# Patient Record
Sex: Female | Born: 1980 | Race: White | Marital: Married | Smoking: Former smoker
Health system: Southern US, Community
[De-identification: ages and names within clinical notes are randomized; demographics above are authoritative.]

## PROBLEM LIST (undated history)

## (undated) DIAGNOSIS — Z789 Other specified health status: Secondary | ICD-10-CM

## (undated) HISTORY — PX: WISDOM TOOTH EXTRACTION: SHX21

---

## 2014-01-05 ENCOUNTER — Encounter: Payer: Self-pay | Admitting: Obstetrics and Gynecology

## 2014-04-10 ENCOUNTER — Observation Stay: Payer: Self-pay | Admitting: Obstetrics and Gynecology

## 2014-07-10 ENCOUNTER — Inpatient Hospital Stay: Payer: Self-pay | Admitting: Obstetrics and Gynecology

## 2014-07-10 LAB — CBC WITH DIFFERENTIAL/PLATELET
BASOS ABS: 0 10*3/uL (ref 0.0–0.1)
Basophil %: 0.3 %
EOS PCT: 0.7 %
Eosinophil #: 0.1 10*3/uL (ref 0.0–0.7)
HCT: 34.6 % — ABNORMAL LOW (ref 35.0–47.0)
HGB: 11.4 g/dL — AB (ref 12.0–16.0)
LYMPHS ABS: 1.7 10*3/uL (ref 1.0–3.6)
LYMPHS PCT: 19.4 %
MCH: 29.2 pg (ref 26.0–34.0)
MCHC: 33 g/dL (ref 32.0–36.0)
MCV: 89 fL (ref 80–100)
MONO ABS: 0.8 x10 3/mm (ref 0.2–0.9)
MONOS PCT: 9.1 %
Neutrophil #: 6.3 10*3/uL (ref 1.4–6.5)
Neutrophil %: 70.5 %
PLATELETS: 120 10*3/uL — AB (ref 150–440)
RBC: 3.91 10*6/uL (ref 3.80–5.20)
RDW: 14.1 % (ref 11.5–14.5)
WBC: 9 10*3/uL (ref 3.6–11.0)

## 2014-07-13 LAB — CBC WITH DIFFERENTIAL/PLATELET
BASOS ABS: 0 10*3/uL (ref 0.0–0.1)
Basophil %: 0.1 %
EOS ABS: 0.1 10*3/uL (ref 0.0–0.7)
Eosinophil %: 0.7 %
HCT: 27.8 % — ABNORMAL LOW (ref 35.0–47.0)
HGB: 8.9 g/dL — AB (ref 12.0–16.0)
LYMPHS ABS: 1.7 10*3/uL (ref 1.0–3.6)
Lymphocyte %: 13.5 %
MCH: 28.6 pg (ref 26.0–34.0)
MCHC: 32.1 g/dL (ref 32.0–36.0)
MCV: 89 fL (ref 80–100)
Monocyte #: 1 x10 3/mm — ABNORMAL HIGH (ref 0.2–0.9)
Monocyte %: 7.6 %
Neutrophil #: 9.9 10*3/uL — ABNORMAL HIGH (ref 1.4–6.5)
Neutrophil %: 78.1 %
Platelet: 121 10*3/uL — ABNORMAL LOW (ref 150–440)
RBC: 3.12 10*6/uL — ABNORMAL LOW (ref 3.80–5.20)
RDW: 14.3 % (ref 11.5–14.5)
WBC: 12.7 10*3/uL — AB (ref 3.6–11.0)

## 2014-07-13 LAB — HEMATOCRIT: HCT: 26.7 % — ABNORMAL LOW (ref 35.0–47.0)

## 2016-11-18 LAB — OB RESULTS CONSOLE HIV ANTIBODY (ROUTINE TESTING): HIV: NONREACTIVE

## 2016-11-19 ENCOUNTER — Other Ambulatory Visit: Payer: Self-pay | Admitting: Obstetrics and Gynecology

## 2016-11-19 DIAGNOSIS — Z369 Encounter for antenatal screening, unspecified: Secondary | ICD-10-CM

## 2016-11-20 ENCOUNTER — Encounter (HOSPITAL_COMMUNITY): Payer: Self-pay | Admitting: Obstetrics and Gynecology

## 2016-11-24 LAB — OB RESULTS CONSOLE GC/CHLAMYDIA: CHLAMYDIA, DNA PROBE: NEGATIVE

## 2016-11-24 LAB — OB RESULTS CONSOLE RPR: RPR: NONREACTIVE

## 2016-11-25 LAB — OB RESULTS CONSOLE GC/CHLAMYDIA: GC PROBE AMP, GENITAL: NEGATIVE

## 2016-12-04 ENCOUNTER — Ambulatory Visit: Payer: Self-pay

## 2016-12-04 ENCOUNTER — Ambulatory Visit (HOSPITAL_COMMUNITY): Payer: Self-pay

## 2016-12-15 ENCOUNTER — Ambulatory Visit: Payer: Self-pay

## 2016-12-15 ENCOUNTER — Ambulatory Visit: Admission: RE | Admit: 2016-12-15 | Payer: Self-pay | Source: Ambulatory Visit

## 2017-01-28 NOTE — H&P (Signed)
Chief Complaint:   Chief Complaint  Patient presents with  . Miscarriage  . Pre Op Consulting    HPI:  Tracey Terry is a 36 y.o. G2P1101 here for female here for Miscarriage and Pre Op Consulting   Context:   Tracey Terry learned yesterday on ultrasound that she has a fetal demise at 20.0 weeks.  We do not know the cause nor the implications to future pregnancies.  She was offered induction vs surgical evacuation of the uterus, and she has elected for D&E.  She presents today to sign consents, to have laminaria placed due to advanced gestational age, and to have blood drawn.  Pregnancy details: Estimated Date of Delivery: 06/16/17 Patient's last menstrual period was 09/05/2016 (exact date). this was consistent with a late-first trimester ultrasound to screen for aneuploidy, which was negative, NT 1.8.   Factors complicating this pregnancy   Obesity BMI 32.9   1mg  folic acid  P/C ratio: 53  Early Glucola: 75  AMA  Screening results and needs:  NOB:   blood type: A+   Ab screen  Neg  Pap  Neg/gc/c neg/  hpv neg HIV Neg  Hep B/RPR Neg/NR  Rubella  Immune  VZV Immune,Ua culture negative, Early glucola 75, CMP =normal , Prot/Creat (random) normal  [place]: 01/27/17: No sign of cardiac activity. Area of heart appears as single fluid filled area. Ascites seen surrounding fetal abdomen and head. Fetal chest appears compressed. By dates fetus should be [redacted]w[redacted]d, Measures [redacted]W[redacted]D. Early Fetal Demise with unknown etiology.   She denies any recent illness, or toxin exposure, does endorse painful varicose veins on her right leg.  Problem List  Date Reviewed: 12/31/16         Codes Priority Class Noted - Resolved   Supervision of other normal pregnancy, antepartum ICD-10-CM: Z34.80 ICD-9-CM: V22.1   Unknown - Present   Encounter for supervision of normal first pregnancy in first trimester ICD-10-CM: Z34.01 ICD-9-CM: V22.0   11/18/2016 - Present   Overview Addendum 01/27/2017 11:41 AM by Launa Flight, CMA    36 y.o. G2P1001 at [redacted]w[redacted]d by  LMP c/w  week ultrasound Sex of baby and name:  " "  Factors complicating this pregnancy   Obesity BMI 32.9 - early 1 hour GTT, CMP, p/c ratio, 1mg  folic acid, TWG 11-20 pounds   AMA  Screening results and needs:  NOB:   MBT A+   Ab screen  Neg  Pap  Neg/gc/c neg/  hpv neg HIV Neg  Hep B/RPR Neg/NR  Rubella  Immune  VZV Immune,Ua culture negative, Early glucola 75, CMP =normal , Prot/Creat (random) normal  First trimester: (Did they do cffDNA or NT/blood draw?)  Informaseq:   NT:1.8 MM      Second trimester (AFP/tetra): Declined December 31, 2016 Declined CF  28 weeks:   Blood consent:  Hgb:   Glucola:  Rhogam:   36 weeks:   GBS:   G/C:   Hgb:   Last [place]: 01/27/17: No sign of cardiac activity. Area of heart appears as single fluid filled area. Ascites seen surrounding fetal abdomen and head. Fetal chest appears compressed. By dates fetus should be [redacted]w[redacted]d, Measures [redacted]W[redacted]D. Early Fetal Demise with unknown etiology.   12/05/16 First trimester Screening U/S NT=1.8 MM, Left ovary contains a corpus luteum that measures 1.2 x01.1 x 1.1 cm.CRL= 60.4 MM, FHR= 160 BPM, Choroid plexus appears symmetric.  Immunization:    Flu in season -   Tdap at 27-36 weeks -   Social: no  changes  Contraception Plan:   Feeding Plan:        Obesity in pregnancy, antepartum ICD-10-CM: O99.210 ICD-9-CM: 649.13   11/18/2016 - Present   Overview Signed 11/18/2016  6:07 PM by Leamon Arnt, CNM     early 1 hour GTT, CMP, p/c ratio, 1mg  folic acid, TWG 11-20 pounds       AMA (advanced maternal age) multigravida 35+, first trimester ICD-10-CM: O09.521 ICD-9-CM: 659.63   11/18/2016 - Present   Overview Signed 12/05/2016  1:44 PM by Marjean Donna, CGC    12/05/16 GC for AMA.  Patient elects for first trimester screening.  CF and SMA screening declined.      RESOLVED: GBS (group B Streptococcus carrier), +RV culture, currently pregnant ICD-10-CM:  O99.820 ICD-9-CM: V23.89, V02.51   06/14/2014 - 11/18/2016   RESOLVED: Obesity, unspecified ICD-10-CM: E66.9 ICD-9-CM: 278.00   01/31/2014 - 11/18/2016       Past Medical History:  has a past medical history of Obesity, unspecified.  Past Surgical History:  has a past surgical history that includes wisdom teeth extraction. Family History: family history includes Breast cancer (age of onset: 40) in her maternal grandmother; Colon cancer in her paternal grandmother; High blood pressure (Hypertension) in her father. Social History:  reports that she has quit smoking. She has never used smokeless tobacco. She reports that she does not drink alcohol or use drugs. OB/GYN History:  OB History    Gravida Para Term Preterm AB Living   2 1 1     1    SAB TAB Ectopic Molar Multiple Live Births             1      Allergies: has No Known Allergies. Medications:  Current Outpatient Prescriptions prescribed today's visit: .  [START ON 01/30/2017] doxycycline (VIBRAMYCIN) 100 MG capsule, Take 2 capsules (200 mg total) by mouth once for 1 dose., Disp: 2 capsule, Rfl: 0 .  ibuprofen (ADVIL,MOTRIN) 800 MG tablet, Take 1 tablet (800 mg total) by mouth every 6 (six) hours as needed for Pain for up to 10 days., Disp: 65 tablet, Rfl: 1 .  LORazepam (ATIVAN) 0.5 MG tablet, Take 1 tablet (0.5 mg total) by mouth every 8 (eight) hours as needed for Anxiety for up to 10 days., Disp: 21 tablet, Rfl: 0 .  methylergonovine (METHERGINE) 0.2 mg tablet, Take 1 tablet (0.2 mg total) by mouth 4 (four) times daily., Disp: 12 tablet, Rfl: 0 .  oxyCODONE-acetaminophen (PERCOCET) 5-325 mg tablet, Take 1 tablet by mouth every 6 (six) hours as needed for Pain., Disp: 21 tablet, Rfl: 0 .  prenatal vit-iron fumarate-FA (PRENAVITE) tablet, Take 1 tablet by mouth once daily., Disp: , Rfl:   Review of Systems: General:   No fatigue or weight loss Eyes:   No vision changes Ears:   No hearing difficulty Respiratory:                No  cough or shortness of breath Pulmonary:   No asthma or shortness of breath Cardiovascular:        No chest pain, palpitations, dyspnea on exertion Gastrointestinal:          No abdominal bloating, chronic diarrhea, constipation, masses, pain or hematochezia Genitourinary:  No hematuria, dysuria, abnormal vaginal discharge, pelvic pain Lymphatic:  No swollen lymph nodes Musculoskeletal: No muscle weakness +varicose veins on right leg Neurologic:  No extremity weakness, syncope, seizure disorder Psychiatric:  No history of depression, delusions or suicidal/homicidal ideation  Exam:   BP 120/69   Pulse 69   Ht 172.7 cm (5' 7.99")   Wt (!) 102.1 kg (225 lb)   LMP 09/05/2016 (Exact Date)   BMI 34.22 kg/m   Body mass index is 34.22 kg/m.  WDWN white female in NAD   Lungs: CTA  CV : RRR without murmur   Breast: exam done in sitting and lying position : No dimpling or retraction, no dominant mass, no spontaneous discharge, no axillary adenopathy Neck:  no thyromegaly Abdomen: soft , no mass, normal active bowel sounds,  non-tender, no rebound tenderness LE: right leg with long-standing spider veins, beneath there are lumpy and trace-able veins with fullness and some tender spots.  No erythema or warmth. Pelvic: tanner stage 5 ,   External genitalia: vulva /labia no lesions, no varicosities  Urethra: no prolapse, no diverticulum, no caruncle  Bladder: no tenderness to palpation, no cystocele  Vagina: normal physiologic d/c, no lesions, normal apical support  Cervix: no lesions, no cervical motion tenderness    Uterus: 20wk size, gravid, non-tender  Adnexa: no masses bilaterally, non-tender      Impression:   The encounter diagnosis was Fetal demise before 22 weeks with retention of dead fetus.    Plan:   - laminaria placed today, 2x 3mm and 2x 4mm inserted without difficulty.   - labs to test for etiology of demise ordered, collected see below: - return tomorrow for  removal and replacement of laminaria.   D&E scheduled for 01/30/17.  Consents signed today, pre-op tomorrow @ [facility]. The patient and I discussed the technical aspects of the procedure including the potential for risks and complications.These include but are not limited to the risk of infection requiring post-operative antibiotics or further procedures.We talked about the risk of injury to adjacent organs including bladder, bowel, ureter, blood vessels or nerves, the need to convert to an open incision, possibleneed for blood transfusion andpostop complications such asthromboembolic or cardiopulmonary complications.All of her questions were answered. Her preoperative exam was completed andthe appropriate consents were signed.   - heat packs to varicosities - gave Rx for before and after procedures with instructions.  See list above     Orders Placed This Encounter  Procedures  . CBC w/auto Differential (5 Part)  . Parvovirus B19, Human, IgG/IgM - [facility]  . RPR - [facility]  . Lupus Anticoagulant Comp - [facility]  . Anticardiolip Ab, IgA/G/M, Qn - [facility]  . Thyroid Stimulating-Hormone (TSH)  . Antibody Screen - [facility]  . Toxoplasma gondii Ab, IgG, Qn - [facility]  . Toxoplasma gondii Ab,IgM,Qn - [facility]  . CMV Abs IgG/IgM - [facility]  . Comprehensive Metabolic Panel (CMP)    Standing Status:   Future    Number of Occurrences:   1    Standing Expiration Date:   01/28/2018  . HSV, IgM I/II Combination - [facility]    Standing Status:   Future    Number of Occurrences:   1    Standing Expiration Date:   01/28/2018    ----- Ranae Plumberhelsea Ward, MD Attending Obstetrician and Gynecologist [facility], Department of OB/GYN [facility]

## 2017-01-29 ENCOUNTER — Encounter
Admission: RE | Admit: 2017-01-29 | Discharge: 2017-01-29 | Disposition: A | Discharge status: Home / Self Care | Payer: BLUE CROSS/BLUE SHIELD | Source: Ambulatory Visit | Attending: Obstetrics & Gynecology | Admitting: Obstetrics & Gynecology

## 2017-01-29 DIAGNOSIS — Z3A20 20 weeks gestation of pregnancy: Secondary | ICD-10-CM | POA: Diagnosis not present

## 2017-01-29 DIAGNOSIS — R188 Other ascites: Secondary | ICD-10-CM | POA: Diagnosis not present

## 2017-01-29 DIAGNOSIS — O021 Missed abortion: Secondary | ICD-10-CM | POA: Diagnosis present

## 2017-01-29 DIAGNOSIS — Z6832 Body mass index (BMI) 32.0-32.9, adult: Secondary | ICD-10-CM | POA: Diagnosis not present

## 2017-01-29 DIAGNOSIS — Z87891 Personal history of nicotine dependence: Secondary | ICD-10-CM | POA: Diagnosis not present

## 2017-01-29 DIAGNOSIS — E669 Obesity, unspecified: Secondary | ICD-10-CM | POA: Diagnosis not present

## 2017-01-29 DIAGNOSIS — O99214 Obesity complicating childbirth: Secondary | ICD-10-CM | POA: Diagnosis not present

## 2017-01-29 LAB — TYPE AND SCREEN
ABO/RH(D): A POS
ANTIBODY SCREEN: NEGATIVE
EXTEND SAMPLE REASON: UNDETERMINED

## 2017-01-29 NOTE — Patient Instructions (Signed)
Your procedure is scheduled on: 01/30/17 Report to DAY SURGERY. 2ND FLOOR MEDICAL MALL ENTRANCE. To find out your arrival time please call [phone] between 1PM - 3PM on 01/29/17.  Remember: Instructions that are not followed completely may result in serious medical risk, up to and including death, or upon the discretion of your surgeon and anesthesiologist your surgery may need to be rescheduled.    __X__ 1. Do not eat food or drink liquids after midnight. No gum chewing or hard candies.     __X__ 2. No Alcohol for 24 hours before or after surgery.   ____ 3. Bring all medications with you on the day of surgery if instructed.    __X__ 4. Notify your doctor if there is any change in your medical condition     (cold, fever, infections).             ___x__5. No smoking within 24 hours of your surgery.     Do not wear jewelry, make-up, hairpins, clips or nail polish.  Do not wear lotions, powders, or perfumes.   Do not shave 48 hours prior to surgery. Men may shave face and neck.  Do not bring valuables to the hospital.    [facility] is not responsible for any belongings or valuables.               Contacts, dentures or bridgework may not be worn into surgery.  Leave your suitcase in the car. After surgery it may be brought to your room.  For patients admitted to the hospital, discharge time is determined by your                treatment team.   Patients discharged the day of surgery will not be allowed to drive home.   Please read over the following fact sheets that you were given:   Pain Booklet and MRSA Information   __x__ Take these medicines the morning of surgery with A SIP OF WATER:    1. May take Lorazepam if needed for anxiety or Oxycodone if needed for pain  2.   3.   4.  5.  6.  ____ Fleet Enema (as directed)   ____ Use CHG Soap as directed  ____ Use inhalers on the day of surgery  ____ Stop metformin 2 days prior to surgery    ____ Take 1/2 of usual insulin  dose the night before surgery and none on the morning of surgery.   ____ Stop Coumadin/Plavix/aspirin on   __X__ Stop Anti-inflammatories such as Advil, Aleve, Ibuprofen, Motrin, Naproxen, Naprosyn, Goodies,powder, or aspirin products.  OK to take Tylenol.   ____ Stop supplements until after surgery.    ____ Bring C-Pap to the hospital.

## 2017-01-30 ENCOUNTER — Ambulatory Visit: Payer: BLUE CROSS/BLUE SHIELD | Admitting: Anesthesiology

## 2017-01-30 ENCOUNTER — Encounter
Admission: RE | Disposition: A | Discharge status: Home / Self Care | Payer: Self-pay | Source: Ambulatory Visit | Attending: Obstetrics & Gynecology

## 2017-01-30 ENCOUNTER — Ambulatory Visit
Admission: RE | Admit: 2017-01-30 | Discharge: 2017-01-30 | Disposition: A | Discharge status: Home / Self Care | Payer: BLUE CROSS/BLUE SHIELD | Source: Ambulatory Visit | Attending: Obstetrics & Gynecology | Admitting: Obstetrics & Gynecology

## 2017-01-30 ENCOUNTER — Encounter: Payer: Self-pay | Admitting: *Deleted

## 2017-01-30 DIAGNOSIS — E669 Obesity, unspecified: Secondary | ICD-10-CM | POA: Insufficient documentation

## 2017-01-30 DIAGNOSIS — R188 Other ascites: Secondary | ICD-10-CM | POA: Insufficient documentation

## 2017-01-30 DIAGNOSIS — O021 Missed abortion: Secondary | ICD-10-CM | POA: Diagnosis not present

## 2017-01-30 DIAGNOSIS — Z3A20 20 weeks gestation of pregnancy: Secondary | ICD-10-CM | POA: Insufficient documentation

## 2017-01-30 DIAGNOSIS — Z87891 Personal history of nicotine dependence: Secondary | ICD-10-CM | POA: Insufficient documentation

## 2017-01-30 DIAGNOSIS — O99214 Obesity complicating childbirth: Secondary | ICD-10-CM | POA: Insufficient documentation

## 2017-01-30 DIAGNOSIS — Z6832 Body mass index (BMI) 32.0-32.9, adult: Secondary | ICD-10-CM | POA: Insufficient documentation

## 2017-01-30 HISTORY — PX: DILATION AND EVACUATION: SHX1459

## 2017-01-30 LAB — ABO/RH: ABO/RH(D): A POS

## 2017-01-30 LAB — KLEIHAUER-BETKE STAIN
FETAL CELLS %: 0 %
QUANTITATION FETAL HEMOGLOBIN: 0 mL

## 2017-01-30 SURGERY — DILATION AND EVACUATION, UTERUS, SECOND TRIMESTER
Anesthesia: General

## 2017-01-30 MED ORDER — PROPOFOL 10 MG/ML IV BOLUS
INTRAVENOUS | Status: DC | PRN
  Administered 2017-01-30: 200 mg via INTRAVENOUS

## 2017-01-30 MED ORDER — MIDAZOLAM HCL 2 MG/2ML IJ SOLN
INTRAMUSCULAR | Status: AC
  Filled 2017-01-30: qty 2

## 2017-01-30 MED ORDER — ROCURONIUM BROMIDE 100 MG/10ML IV SOLN
INTRAVENOUS | Status: DC | PRN
  Administered 2017-01-30 (×2): 20 mg via INTRAVENOUS

## 2017-01-30 MED ORDER — KETOROLAC TROMETHAMINE 30 MG/ML IJ SOLN
INTRAMUSCULAR | Status: DC | PRN
  Administered 2017-01-30: 30 mg via INTRAVENOUS

## 2017-01-30 MED ORDER — LIDOCAINE HCL (PF) 1 % IJ SOLN
INTRAMUSCULAR | Status: AC
  Filled 2017-01-30: qty 30

## 2017-01-30 MED ORDER — PROMETHAZINE HCL 25 MG/ML IJ SOLN
12.5000 mg | INTRAMUSCULAR | Status: DC | PRN
  Administered 2017-01-30: 12.5 mg via INTRAVENOUS

## 2017-01-30 MED ORDER — MISOPROSTOL 200 MCG PO TABS
ORAL_TABLET | ORAL | Status: AC
  Filled 2017-01-30: qty 5

## 2017-01-30 MED ORDER — FENTANYL CITRATE (PF) 100 MCG/2ML IJ SOLN
INTRAMUSCULAR | Status: AC
Start: 2017-01-30 — End: 2017-01-30
  Filled 2017-01-30: qty 2

## 2017-01-30 MED ORDER — DEXMEDETOMIDINE HCL IN NACL 200 MCG/50ML IV SOLN
INTRAVENOUS | Status: AC
  Filled 2017-01-30: qty 50

## 2017-01-30 MED ORDER — LIDOCAINE HCL (PF) 2 % IJ SOLN
INTRAMUSCULAR | Status: AC
  Filled 2017-01-30: qty 2

## 2017-01-30 MED ORDER — FENTANYL CITRATE (PF) 100 MCG/2ML IJ SOLN: 25.0000 ug | INTRAMUSCULAR | Status: DC | PRN

## 2017-01-30 MED ORDER — OXYCODONE HCL 5 MG/5ML PO SOLN: 5.0000 mg | Freq: Once | ORAL | Status: DC | PRN

## 2017-01-30 MED ORDER — DOXYCYCLINE HYCLATE 100 MG IV SOLR
100.0000 mg | Freq: Once | INTRAVENOUS | Status: AC
  Administered 2017-01-30: 100 mg via INTRAVENOUS
  Filled 2017-01-30: qty 100

## 2017-01-30 MED ORDER — PROMETHAZINE HCL 25 MG/ML IJ SOLN
INTRAMUSCULAR | Status: AC
  Administered 2017-01-30: 12.5 mg via INTRAVENOUS
  Filled 2017-01-30: qty 1

## 2017-01-30 MED ORDER — VASOPRESSIN 20 UNIT/ML IV SOLN
INTRAVENOUS | Status: AC
  Filled 2017-01-30: qty 1

## 2017-01-30 MED ORDER — OXYCODONE HCL 5 MG PO TABS: 5.0000 mg | ORAL_TABLET | Freq: Once | ORAL | Status: DC | PRN

## 2017-01-30 MED ORDER — SODIUM CHLORIDE 0.9 % IJ SOLN
INTRAMUSCULAR | Status: AC
  Filled 2017-01-30: qty 10

## 2017-01-30 MED ORDER — FAMOTIDINE 20 MG PO TABS
20.0000 mg | ORAL_TABLET | Freq: Once | ORAL | Status: AC
  Administered 2017-01-30: 20 mg via ORAL

## 2017-01-30 MED ORDER — DEXAMETHASONE SODIUM PHOSPHATE 10 MG/ML IJ SOLN
INTRAMUSCULAR | Status: DC | PRN
  Administered 2017-01-30: 10 mg via INTRAVENOUS

## 2017-01-30 MED ORDER — DEXMEDETOMIDINE HCL IN NACL 200 MCG/50ML IV SOLN
INTRAVENOUS | Status: DC | PRN
  Administered 2017-01-30: 20 ug via INTRAVENOUS

## 2017-01-30 MED ORDER — ONDANSETRON HCL 4 MG/2ML IJ SOLN
INTRAMUSCULAR | Status: AC
  Filled 2017-01-30: qty 2

## 2017-01-30 MED ORDER — SODIUM CHLORIDE 0.9 % IJ SOLN
INTRAMUSCULAR | Status: AC
  Filled 2017-01-30: qty 50

## 2017-01-30 MED ORDER — ONDANSETRON HCL 4 MG/2ML IJ SOLN
INTRAMUSCULAR | Status: DC | PRN
  Administered 2017-01-30: 4 mg via INTRAVENOUS

## 2017-01-30 MED ORDER — PROPOFOL 10 MG/ML IV BOLUS
INTRAVENOUS | Status: AC
  Filled 2017-01-30: qty 20

## 2017-01-30 MED ORDER — OXYTOCIN 10 UNIT/ML IJ SOLN
INTRAMUSCULAR | Status: DC | PRN
  Administered 2017-01-30: 40 [IU] via INTRAMUSCULAR

## 2017-01-30 MED ORDER — LACTATED RINGERS IV SOLN
INTRAVENOUS | Status: DC
  Administered 2017-01-30: 14:00:00 via INTRAVENOUS
  Administered 2017-01-30: 75 mL/h via INTRAVENOUS

## 2017-01-30 MED ORDER — FAMOTIDINE 20 MG PO TABS
ORAL_TABLET | ORAL | Status: AC
  Administered 2017-01-30: 20 mg via ORAL
  Filled 2017-01-30: qty 1

## 2017-01-30 MED ORDER — OXYTOCIN 10 UNIT/ML IJ SOLN
INTRAMUSCULAR | Status: AC
  Filled 2017-01-30: qty 4

## 2017-01-30 MED ORDER — METHYLERGONOVINE MALEATE 0.2 MG/ML IJ SOLN
INTRAMUSCULAR | Status: DC | PRN
Start: 2017-01-30 — End: 2017-01-30
  Administered 2017-01-30: 0.2 mg via INTRAMUSCULAR

## 2017-01-30 MED ORDER — SUGAMMADEX SODIUM 200 MG/2ML IV SOLN
INTRAVENOUS | Status: AC
  Filled 2017-01-30: qty 2

## 2017-01-30 MED ORDER — LIDOCAINE HCL (CARDIAC) 20 MG/ML IV SOLN
INTRAVENOUS | Status: DC | PRN
  Administered 2017-01-30: 100 mg via INTRAVENOUS

## 2017-01-30 MED ORDER — MISOPROSTOL 100 MCG PO TABS
ORAL_TABLET | ORAL | Status: DC | PRN
  Administered 2017-01-30: 1000 ug via RECTAL

## 2017-01-30 MED ORDER — VASOPRESSIN 20 UNIT/ML IV SOLN
INTRAVENOUS | Status: DC | PRN
Start: 2017-01-30 — End: 2017-01-30
  Administered 2017-01-30: 13.3 [IU]

## 2017-01-30 MED ORDER — MIDAZOLAM HCL 2 MG/2ML IJ SOLN
INTRAMUSCULAR | Status: DC | PRN
  Administered 2017-01-30: 2 mg via INTRAVENOUS

## 2017-01-30 MED ORDER — SUGAMMADEX SODIUM 200 MG/2ML IV SOLN
INTRAVENOUS | Status: DC | PRN
  Administered 2017-01-30: 200 mg via INTRAVENOUS

## 2017-01-30 MED ORDER — FENTANYL CITRATE (PF) 100 MCG/2ML IJ SOLN
INTRAMUSCULAR | Status: DC | PRN
  Administered 2017-01-30: 100 ug via INTRAVENOUS

## 2017-01-30 MED ORDER — DEXAMETHASONE SODIUM PHOSPHATE 10 MG/ML IJ SOLN
INTRAMUSCULAR | Status: AC
  Filled 2017-01-30: qty 1

## 2017-01-30 SURGICAL SUPPLY — 24 items
CANISTER SUC SOCK COL 7IN (MISCELLANEOUS) ×3 IMPLANT
CATH ROBINSON RED A/P 16FR (CATHETERS) ×3 IMPLANT
FILTER UTR ASPR SPEC (MISCELLANEOUS) ×1 IMPLANT
FLTR UTR ASPR SPEC (MISCELLANEOUS) ×3
GLOVE PI ORTHOPRO 6.5 (GLOVE) ×2
GLOVE PI ORTHOPRO STRL 6.5 (GLOVE) ×1 IMPLANT
GLOVE SURG SYN 6.5 ES PF (GLOVE) ×3 IMPLANT
GOWN STRL REUS W/ TWL LRG LVL3 (GOWN DISPOSABLE) ×2 IMPLANT
GOWN STRL REUS W/TWL LRG LVL3 (GOWN DISPOSABLE) ×4
KIT BERKELEY 1ST TRIMESTER 3/8 (MISCELLANEOUS) ×3 IMPLANT
KIT RM TURNOVER CYSTO AR (KITS) ×3 IMPLANT
NEEDLE SPNL 20GX3.5 QUINCKE YW (NEEDLE) ×3 IMPLANT
NS IRRIG 500ML POUR BTL (IV SOLUTION) ×3 IMPLANT
PACK DNC HYST (MISCELLANEOUS) ×3 IMPLANT
PAD OB MATERNITY 4.3X12.25 (PERSONAL CARE ITEMS) ×3 IMPLANT
PAD PREP 24X41 OB/GYN DISP (PERSONAL CARE ITEMS) ×3 IMPLANT
SET BERKELEY SUCTION TUBING (SUCTIONS) ×3 IMPLANT
SUT CHROMIC 3 0 SH 27 (SUTURE) ×3 IMPLANT
SYRINGE 10CC LL (SYRINGE) ×3 IMPLANT
TOWEL OR 17X26 4PK STRL BLUE (TOWEL DISPOSABLE) ×3 IMPLANT
TUBE VACURETTE 2ND TRIMESTER (CANNULA) ×3 IMPLANT
VACURETTE 10 RIGID CVD (CANNULA) IMPLANT
VACURETTE 12 RIGID CVD (CANNULA) IMPLANT
VACURETTE 8 RIGID CVD (CANNULA) IMPLANT

## 2017-01-30 NOTE — Anesthesia Procedure Notes (Signed)
Procedure Name: Intubation Date/Time: 01/30/2017 1:49 PM Performed by: Justus Memory Pre-anesthesia Checklist: Patient identified, Emergency Drugs available, Suction available, Patient being monitored and Timeout performed Patient Re-evaluated:Patient Re-evaluated prior to inductionOxygen Delivery Method: Circle system utilized Preoxygenation: Pre-oxygenation with 100% oxygen Intubation Type: IV induction and Rapid sequence Laryngoscope Size: Mac and 3 Grade View: Grade I Tube type: Oral Tube size: 7.0 mm Number of attempts: 1 Airway Equipment and Method: Stylet and Patient positioned with wedge pillow Placement Confirmation: ETT inserted through vocal cords under direct vision,  positive ETCO2,  CO2 detector and breath sounds checked- equal and bilateral Secured at: 21 cm Tube secured with: Tape Dental Injury: Teeth and Oropharynx as per pre-operative assessment

## 2017-01-30 NOTE — Transfer of Care (Signed)
Immediate Anesthesia Transfer of Care Note  Patient: Tracey Terry  Procedure(s) Performed: Procedure(s): DILATATION AND EVACUATION (D&E) 2ND TRIMESTER (N/A)  Patient Location: PACU  Anesthesia Type:General  Level of Consciousness: sedated  Airway & Oxygen Therapy: Patient Spontanous Breathing and Patient connected to face mask oxygen  Post-op Assessment: Report given to RN and Post -op Vital signs reviewed and stable  Post vital signs: Reviewed and stable  Last Vitals:  Vitals:   01/30/17 1503 01/30/17 1504  BP:  (!) 100/58  Pulse: 78   Resp: 18   Temp: 36.2 C     Last Pain:  Vitals:   01/30/17 1503  TempSrc:   PainSc: Asleep      Patients Stated Pain Goal: 0 (01/30/17 1114)  Complications: No apparent anesthesia complications

## 2017-01-30 NOTE — Anesthesia Post-op Follow-up Note (Cosign Needed)
Anesthesia QCDR form completed.        

## 2017-01-30 NOTE — Discharge Instructions (Signed)
You should expect to have some cramping and vaginal bleeding for about a week. This should taper off and subside, much like a period. If heavy bleeding continues or gets worse, you should contact the office for an earlier appointment.   Please call the office or physician on call for fever >101, severe pain, and heavy bleeding.   [phone]  NOTHING IN THE VAGINA FOR 2 WEEKS!!   AMBULATORY SURGERY  DISCHARGE INSTRUCTIONS   1) The drugs that you were given will stay in your system until tomorrow so for the next 24 hours you should not:  A) Drive an automobile B) Make any legal decisions C) Drink any alcoholic beverage   2) You may resume regular meals tomorrow.  Today it is better to start with liquids and gradually work up to solid foods.  You may eat anything you prefer, but it is better to start with liquids, then soup and crackers, and gradually work up to solid foods.   3) Please notify your doctor immediately if you have any unusual bleeding, trouble breathing, redness and pain at the surgery site, drainage, fever, or pain not relieved by medication.   4) Additional Instructions:

## 2017-01-30 NOTE — Pre-Procedure Instructions (Signed)
Appointment 02/06/18 Friday @ 3pm.

## 2017-01-30 NOTE — Anesthesia Preprocedure Evaluation (Signed)
Anesthesia Evaluation  Patient identified by MRN, date of birth, ID band Patient awake    Reviewed: Allergy & Precautions, H&P , NPO status , Patient's Chart, lab work & pertinent test results  History of Anesthesia Complications Negative for: history of anesthetic complications  Airway Mallampati: II  TM Distance: >3 FB Neck ROM: full    Dental  (+) Poor Dentition, Chipped   Pulmonary neg shortness of breath, former smoker,    Pulmonary exam normal breath sounds clear to auscultation       Cardiovascular Exercise Tolerance: Good (-) angina(-) Past MI and (-) DOE negative cardio ROS Normal cardiovascular exam Rhythm:regular Rate:Normal     Neuro/Psych negative neurological ROS  negative psych ROS   GI/Hepatic negative GI ROS, Neg liver ROS, neg GERD  ,  Endo/Other  negative endocrine ROS  Renal/GU      Musculoskeletal   Abdominal   Peds  Hematology negative hematology ROS (+)   Anesthesia Other Findings  Past Surgical History: No date: WISDOM TOOTH EXTRACTION  BMI    Body Mass Index:  35.24 kg/m      Reproductive/Obstetrics (+) Pregnancy                             Anesthesia Physical Anesthesia Plan  ASA: II  Anesthesia Plan: General ETT   Post-op Pain Management:    Induction:   Airway Management Planned:   Additional Equipment:   Intra-op Plan:   Post-operative Plan:   Informed Consent: I have reviewed the patients History and Physical, chart, labs and discussed the procedure including the risks, benefits and alternatives for the proposed anesthesia with the patient or authorized representative who has indicated his/her understanding and acceptance.   Dental Advisory Given  Plan Discussed with: Anesthesiologist, CRNA and Surgeon  Anesthesia Plan Comments:         Anesthesia Quick Evaluation

## 2017-01-30 NOTE — Op Note (Signed)
Operative Report Dilation and Evacuation   Indications:  20 week IUFD, s/p laminaria   Pre-operative Diagnosis: second trimester missed abortion/IUFD  Post-operative Diagnosis: same  Procedure: Ultrasound-Guided Dilation and Evacuation (16109, [id number]), placement of paracervical block (60454), and manual cervical dilation (09811)  Surgeon: Tracey Plumber, MD  Assistant(s):  no MD available  Anesthesia: General endotracheal anesthesia  Intraoperative medications: 0.75mcg Methergine IM, 100mg  IV doxycyline, 20cc 1%lidocaine with pitressin, cytotec PR, and 40mg  Oxytocin IV (in 1L crystalloid)      Total IV Fluids:Total I/O In: 1400 [I.V.:1400] Out: 200 [Urine:100; Blood:100]      Findings: Uterus measuring 18weeks;. absent cardiac activity.  Calvarium present with anterior placenta.   normal cervix, vagina, perineum  Post procedure: Calvarium, Thorax, 4x extremities, placenta accounted for.  Uterus with prominent and thin endometrial stripe.  Specimens: products of conception         Complications:  None; patient tolerated the procedure well.         Disposition: PACU - hemodynamically stable.         Condition: stable  Indication for procedure/Consents: Tracey Terry was found to have a fetus without cardiac activity and significant anasarca on her 20 week ultrasound.  She was given option of induction vs D&E and elected the latter.  She was given laminaria over 2 days prior to procedure to dilate the cervix.  Risks of surgery were discussed with the patient including but not limited to: bleeding which may require transfusion; infection which may require antibiotics; injury to uterus or surrounding organs; intrauterine scarring which may impair future fertility; need for additional procedures including laparotomy or laparoscopy; and other postoperative/anesthesia complications. Written informed consent was obtained.     Procedure in Detail:  The patient was  then taken to the operating room where anesthesia was administered and was found to be adequate.  After a formal timeout was performed, she was placed in the dorsal lithotomy position and examined with the above findings. She was then prepped and draped in a sterile manner.  A speculum was then placed in the patient's vagina and a ringed forceps was applied to the anterior lip of the cervix.  A paracervical block was placed with 10cc of Pitressin in Lidocaine in each 4:00 and 8:00 positions.  The 14-french curette was guided into the cervix under ultrasound guidance.  The suction was engaged and an amniotomy was created for clear and bloody fluid.  The curette was removed, and the Bierer forceps were used to evacuate the uterus.  Each entry was visualized on the ultrasound.  Tissue was removed after several passes.  A sharp curettage was then performed until there was a gritty texture in all four quadrants. The suction curette was re-introduced and minimal fluid/tissue was extracted.   A thin and well-definied endometrial stripe was evident on the ultrasound and the procedure was terminated.  The forceps were removed from the anterior lip of the cervix and the vaginal speculum was removed after noting good hemostasis. IM methergine was delivered, as well as cytotec rectally and 40mg  Oxytocin via IV.  Bimanual exam with massage of the fundus was performed and confirming the reduction in size of the uterus.  Following the procedure the following were accounted for: Calvarium 4 extremities Thorax Placenta Tissue was sent to pathology, and cytogenetics   The patient tolerated the procedure well and was taken to the recovery area awake, extubated and in stable condition.  The patient will be discharged to home as per  PACU criteria.  Routine postoperative instructions given. She will follow up in the clinic in one week for postoperative evaluation.  Tracey Plumberhelsea Jnaya Butrick, MD Gynecologist and  Tracey Legatobstetrician [facility]N [facility]

## 2017-01-30 NOTE — Interval H&P Note (Signed)
History and Physical Interval Note:  01/30/2017 12:59 PM   H&P Update  PLEASE SEE PREVIOUS H&P  Pt was last seen in my office, and complete history and physical performed.  The surgical history has been reviewed and remains accurate without interval change. The patient was re-examined and patient's physiologic condition has not changed significantly in the last 2 days.  No new pharmacological allergies or types of therapy has been initiated.  No Known Allergies  History reviewed. No pertinent past medical history. Past Surgical History:  Procedure Laterality Date  . WISDOM TOOTH EXTRACTION      BP 121/71   Pulse (!) 104   Temp 98 F (36.7 C) (Tympanic)   Resp 16   Ht 5\' 7"  (1.702 m)   Wt 102.1 kg (225 lb)   BMI 35.24 kg/m   NAD RRR no murmurs CTAB, no wheezing, resps unlabored +BS, soft, NTTP No c/c/e Pelvic exam deferred  The above history was confirmed with the patient. The condition still exists that makes this procedure necessary. Surgical plan includes ultrasound guided dilation and evacuation, as confirmed on the consent. The treatment plan remains the same, without new options for care.  The patient understands the potential benefits and risks and the consents have been signed and placed on the chart.     Ranae Plumberhelsea Ward, MD Attending Obstetrician Gynecologist [facility], Department of OBGYN [facility]

## 2017-01-30 NOTE — OR Nursing (Signed)
Dr Elesa MassedWard removed 6 laminaria from the vagina prior to prepping and draping witnessed by OR staff

## 2017-01-30 NOTE — OR Nursing (Signed)
Dr. Elesa MassedWard into see pt and husband.

## 2017-02-02 ENCOUNTER — Encounter: Payer: Self-pay | Admitting: Obstetrics & Gynecology

## 2017-02-03 LAB — SURGICAL PATHOLOGY

## 2017-02-10 NOTE — Anesthesia Postprocedure Evaluation (Signed)
Anesthesia Post Note  Patient: Kearney HardSarah Popson  Procedure(s) Performed: Procedure(s) (LRB): DILATATION AND EVACUATION (D&E) 2ND TRIMESTER (N/A)  Patient location during evaluation: PACU Anesthesia Type: General Level of consciousness: awake and alert Pain management: pain level controlled Vital Signs Assessment: post-procedure vital signs reviewed and stable Respiratory status: spontaneous breathing, nonlabored ventilation, respiratory function stable and patient connected to nasal cannula oxygen Cardiovascular status: blood pressure returned to baseline and stable Postop Assessment: no signs of nausea or vomiting Anesthetic complications: no     Last Vitals:  Vitals:   01/30/17 1612 01/30/17 1702  BP: (!) 110/54 (!) 112/55  Pulse: 79 67  Resp: 18 16  Temp: 36.3 C     Last Pain:  Vitals:   01/30/17 1702  TempSrc:   PainSc: 0-No pain                 Yevette EdwardsJames G Adams

## 2017-05-29 DIAGNOSIS — O0993 Supervision of high risk pregnancy, unspecified, third trimester: Secondary | ICD-10-CM | POA: Insufficient documentation

## 2017-05-29 LAB — OB RESULTS CONSOLE RUBELLA ANTIBODY, IGM: Rubella: IMMUNE

## 2017-05-29 LAB — OB RESULTS CONSOLE HIV ANTIBODY (ROUTINE TESTING): HIV: NONREACTIVE

## 2017-05-29 LAB — OB RESULTS CONSOLE HEPATITIS B SURFACE ANTIGEN: Hepatitis B Surface Ag: NEGATIVE

## 2017-05-29 LAB — OB RESULTS CONSOLE VARICELLA ZOSTER ANTIBODY, IGG: Varicella: IMMUNE

## 2017-06-15 ENCOUNTER — Ambulatory Visit
Admission: RE | Admit: 2017-06-15 | Discharge: 2017-06-15 | Disposition: A | Discharge status: Home / Self Care | Payer: BLUE CROSS/BLUE SHIELD | Source: Ambulatory Visit | Attending: Maternal & Fetal Medicine | Admitting: Maternal & Fetal Medicine

## 2017-06-15 ENCOUNTER — Encounter: Payer: Self-pay | Admitting: *Deleted

## 2017-06-15 DIAGNOSIS — Z3A12 12 weeks gestation of pregnancy: Secondary | ICD-10-CM | POA: Insufficient documentation

## 2017-06-15 DIAGNOSIS — Z369 Encounter for antenatal screening, unspecified: Secondary | ICD-10-CM

## 2017-06-15 DIAGNOSIS — O09521 Supervision of elderly multigravida, first trimester: Secondary | ICD-10-CM | POA: Insufficient documentation

## 2017-06-15 HISTORY — DX: Other specified health status: Z78.9

## 2017-06-15 NOTE — Progress Notes (Signed)
Referring Provider: Ranae PlumberWard, Chelsea MD    Length of Consultation: 45 minutes  Ms. Tracey Terry was [facility] for genetic counseling because of advanced maternal age.  The patient will be 36 years old at the time of delivery.  Ms. Tracey Terry was seen for genetic counseling in [facility] in December, 2017 for the same indication.  That pregnancy resulted in a IUFD at [redacted] weeks gestation with a reportedly normal fetal karyotype (normal female).  This note summarizes the information we discussed.    We explained that the chance of a chromosome abnormality increases with maternal age.  Chromosomes and examples of chromosome problems were reviewed.  Humans typically have 46 chromosomes in each cell, with half passed through each sperm and egg.  Any change in the number or structure of chromosomes can increase the risk of problems in the physical and mental development of a pregnancy.   Based upon age of the patient, the midtrimester chance of any chromosome abnormality was 1 in 5383. The chance of Down syndrome, the most common chromosome problem associated with maternal age, was 1 in 72166.  The risk of chromosome problems is in addition to the 3% general population risk for birth defects and mental retardation.  The greatest chance, of course, is that the baby would be born in good health.  We discussed the following prenatal screening and testing options for this pregnancy:  First trimester screening, which includes nuchal translucency ultrasound screen and first trimester maternal serum marker screening.  The nuchal translucency has approximately an 80% detection rate for Down syndrome and can be positive for other chromosome abnormalities as well as heart defects.  When combined with a maternal serum marker screening, the detection rate is up to 90% for Down syndrome and up to 97% for trisomy 18.     The chorionic villus sampling procedure is available for first  trimester chromosome analysis.  This involves the withdrawal of a small amount of chorionic villi (tissue from the developing placenta).  Risk of pregnancy loss is estimated to be approximately 1 in 200 to 1 in 100 (0.5 to 1%).  There is approximately a 1% (1 in 100) chance that the CVS chromosome results will be unclear.  Chorionic villi cannot be tested for neural tube defects.     Maternal serum marker screening, a blood test that measures pregnancy proteins, can provide risk assessments for Down syndrome, trisomy 18, and open neural tube defects (spina bifida, anencephaly). Because it does not directly examine the fetus, it cannot positively diagnose or rule out these problems.  Targeted ultrasound uses high frequency sound waves to create an image of the developing fetus.  An ultrasound is often recommended as a routine means of evaluating the pregnancy.  It is also used to screen for fetal anatomy problems (for example, a heart defect) that might be suggestive of a chromosomal or other abnormality.   Amniocentesis involves the removal of a small amount of amniotic fluid from the sac surrounding the fetus with the use of a thin needle inserted through the maternal abdomen and uterus.  Ultrasound guidance is used throughout the procedure.  Fetal cells from amniotic fluid are directly evaluated and > 99.5% of chromosome problems and > 98% of open neural tube defects can be detected. This procedure is generally performed after the 15th week of pregnancy.  The main risks to this procedure include complications leading to miscarriage in less than 1 in 200 cases (0.5%).  We also reviewed the availability of cell free fetal DNA testing from maternal blood to determine whether or not the baby may have either Down syndrome, trisomy 4, or trisomy 51.  This test utilizes a maternal blood sample and DNA sequencing technology to isolate circulating cell free fetal DNA from maternal plasma.  The fetal DNA can then  be analyzed for DNA sequences that are derived from the three most common chromosomes involved in aneuploidy, chromosomes 13, 18, and 21.  If the overall amount of DNA is greater than the expected level for any of these chromosomes, aneuploidy is suspected.  While we do not consider it a replacement for invasive testing and karyotype analysis, a negative result from this testing would be reassuring, though not a guarantee of a normal chromosome complement for the baby.  An abnormal result is certainly suggestive of an abnormal chromosome complement, though we would still recommend CVS or amniocentesis to confirm any findings from this testing.  Cystic Fibrosis and Spinal Muscular Atrophy (SMA) screening were also discussed with the patient. Both conditions are recessive, which means that both parents must be carriers in order to have a child with the disease.  Cystic fibrosis (CF) is one of the most common genetic conditions in persons of Caucasian ancestry.  This condition occurs in approximately 1 in 2,500 Caucasian persons and results in thickened secretions in the lungs, digestive, and reproductive systems.  For a baby to be at risk for having CF, both of the parents must be carriers for this condition.  Approximately 1 in 75 Caucasian persons is a carrier for CF.  Current carrier testing looks for the most common mutations in the gene for CF and can detect approximately 90% of carriers in the Caucasian population.  This means that the carrier screening can greatly reduce, but cannot eliminate, the chance for an individual to have a child with CF.  If an individual is found to be a carrier for CF, then carrier testing would be available for the partner. As part of [place]'s newborn screening profile, all babies born in the state of [place] will have a two-tier screening process.  Specimens are first tested to determine the concentration of immunoreactive trypsinogen (IRT).  The top 5% of  specimens with the highest IRT values then undergo DNA testing using a panel of over 40 common CF mutations. SMA is a neurodegenerative disorder that leads to atrophy of skeletal muscle and overall weakness.  This condition is also more prevalent in the Caucasian population, with 1 in 40-1 in 60 persons being a carrier and 1 in 6,000-1 in 10,000 children being affected.  There are multiple forms of the disease, with some causing death in infancy to other forms with survival into adulthood.  The genetics of SMA is complex, but carrier screening can detect up to 95% of carriers in the Caucasian population.  Similar to CF, a negative result can greatly reduce, but cannot eliminate, the chance to have a child with SMA.  We obtained a detailed family history and pregnancy history.  As stated previously, the patient's most recent pregnancy resulted in a IUFD at [redacted] weeks gestation (fetus was measuring [redacted] weeks gestation).  As reported by the patient, a chromosome study was performed and reported out as "normal female".  She did not name the fetus.  We were unable to obtain those results though our system or [facility] system.  If the karyotype was performed and was a normal result, we cannot  exclude a genetic etiology.  If the fetus had a chromosome condition, the risk for recurrence (excluding unbalanced translocations) would not be expected to be increased above the patient's age-related risk.   The remainder of the family history is unremarkable for birth defects, developmental delays, recurrent pregnancy loss or known chromosome abnormalities. Consanguinity is denied.  Ms. Fleener reported no additional pregnancy exposures or complications.  After consideration of the options, Ms. Pautler elected to proceed with a first trimester ultrasound today.  She plans to speak with her husband about testing options and call us back if she elects to have screening performed.  At this time, the patient feels that she may want to  pursue prenatal cell-free DNA in the second trimester and if those results are abnormal, may consider amniocentesis.  Ms. Mancini has elected to decline carrier screening for CF and SMA.    An ultrasound was performed at the time of the visit.   Fetal anatomy could not be assessed due to early gestational age.  Please refer to the ultrasound report for details of that study.  Ms. Vought was encouraged to call with questions or concerns.  We can be contacted at [phone].   Tests Ordered: NONE

## 2017-06-15 NOTE — Progress Notes (Signed)
Pt seen by me, agree with assessment and plan as outlined by CGC Nunez.  

## 2017-06-23 ENCOUNTER — Ambulatory Visit
Admission: RE | Admit: 2017-06-23 | Discharge: 2017-06-23 | Disposition: A | Discharge status: Home / Self Care | Payer: BLUE CROSS/BLUE SHIELD | Source: Ambulatory Visit | Attending: Obstetrics and Gynecology | Admitting: Obstetrics and Gynecology

## 2017-06-23 ENCOUNTER — Other Ambulatory Visit: Payer: Self-pay | Admitting: Obstetrics and Gynecology

## 2017-06-23 DIAGNOSIS — M79661 Pain in right lower leg: Secondary | ICD-10-CM | POA: Diagnosis not present

## 2017-09-10 IMAGING — US US EXTREM LOW VENOUS*R*
1 series · 13 of 24 positions shown · non-contrast
Comparison: None.

CLINICAL DATA: Calf pain for 3 months.



[Series 1: us extrem low venous*right* · 0.08mm/px · 13 of 34 slices shown]
[im 1/34]
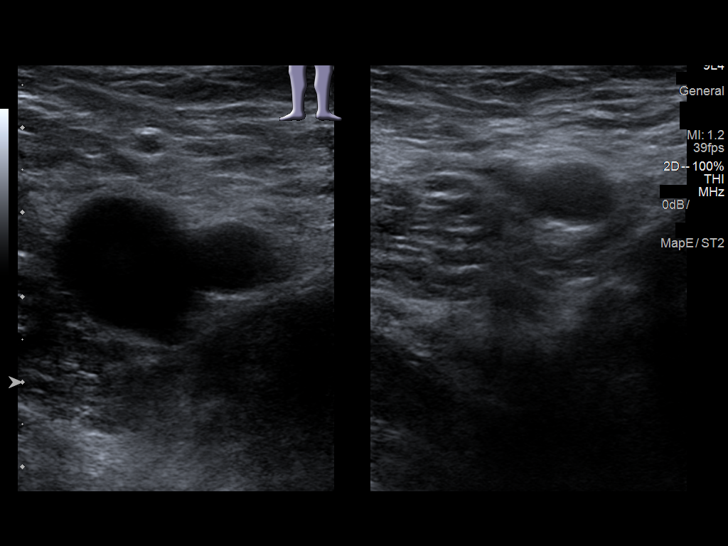
[im 3/34]
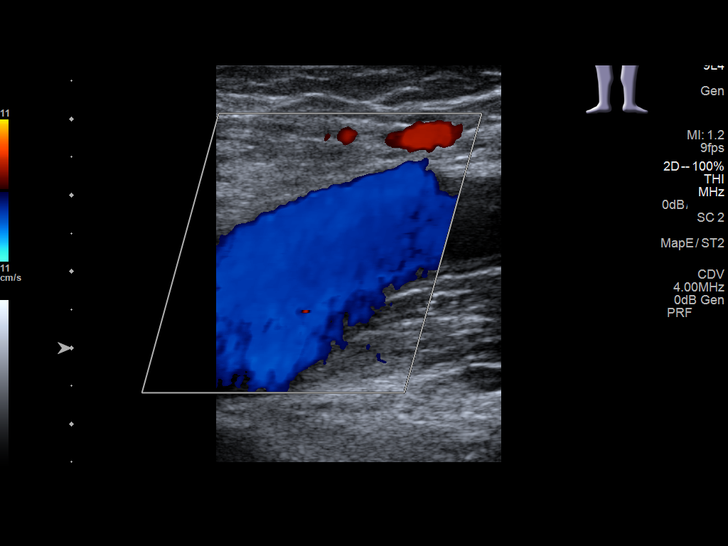
[im 6/34]
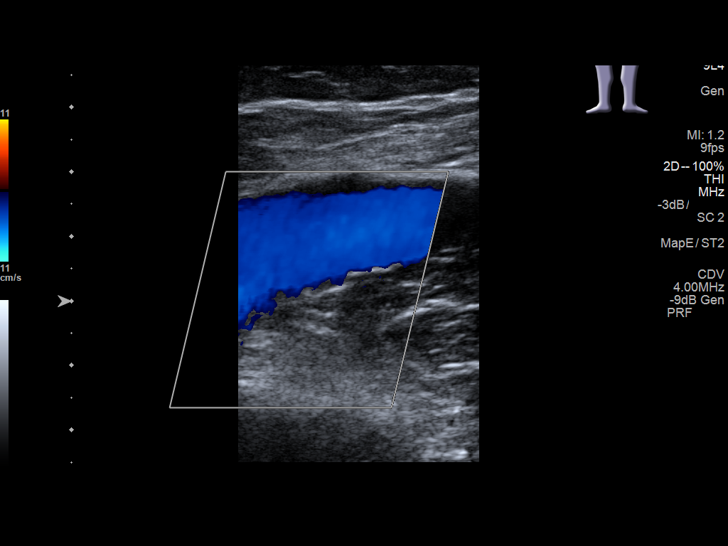
[im 9/34]
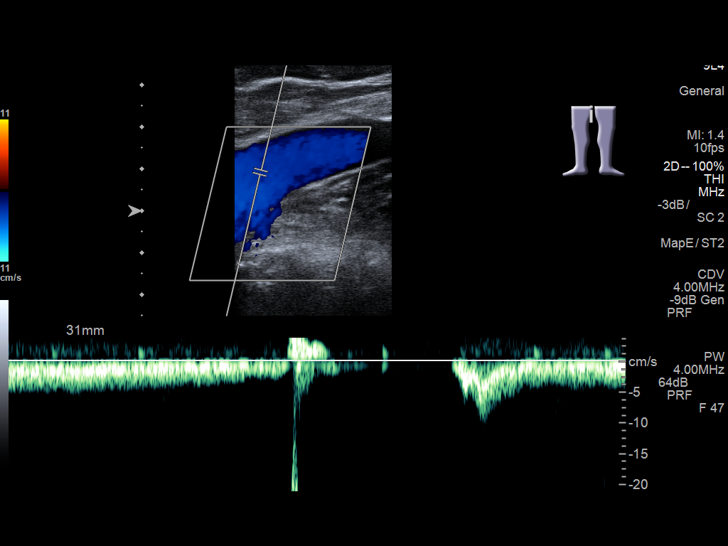
[im 12/34]
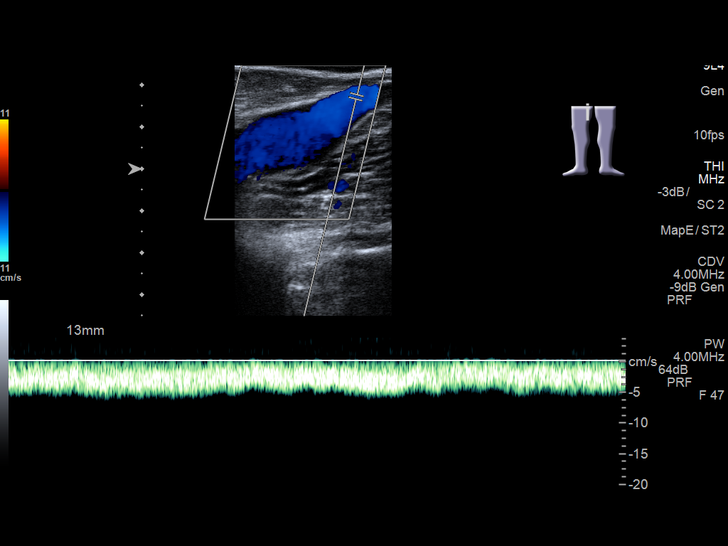
[im 15/34]
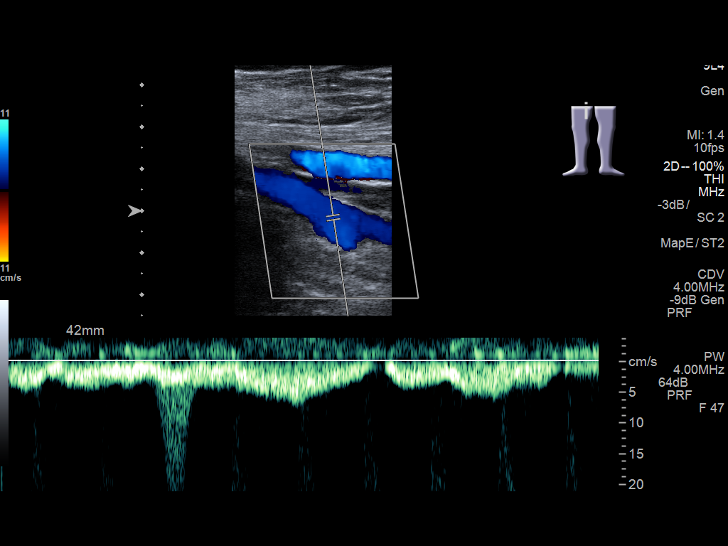
[im 18/34]
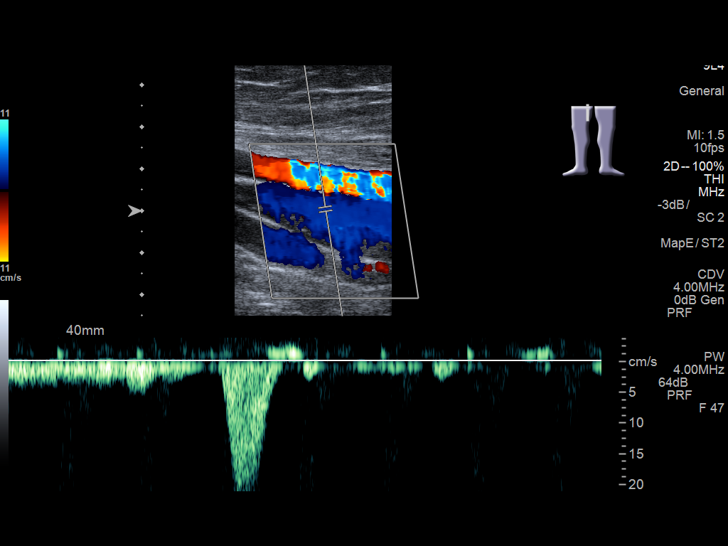
[im 19/34]
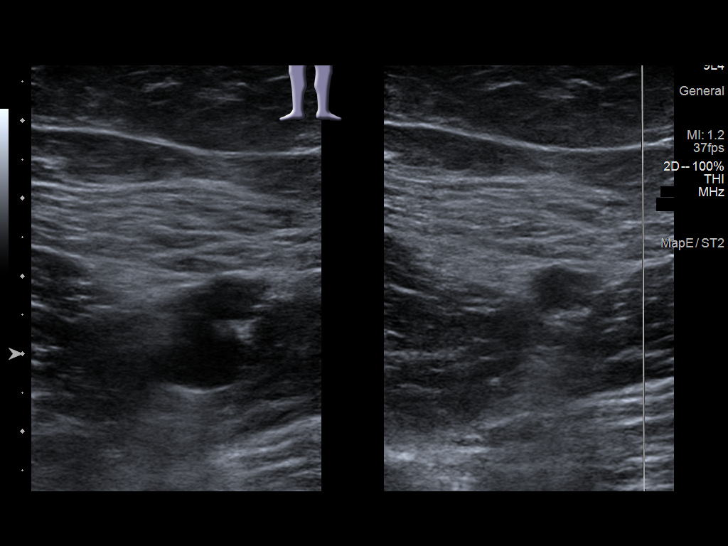
[im 22/34]
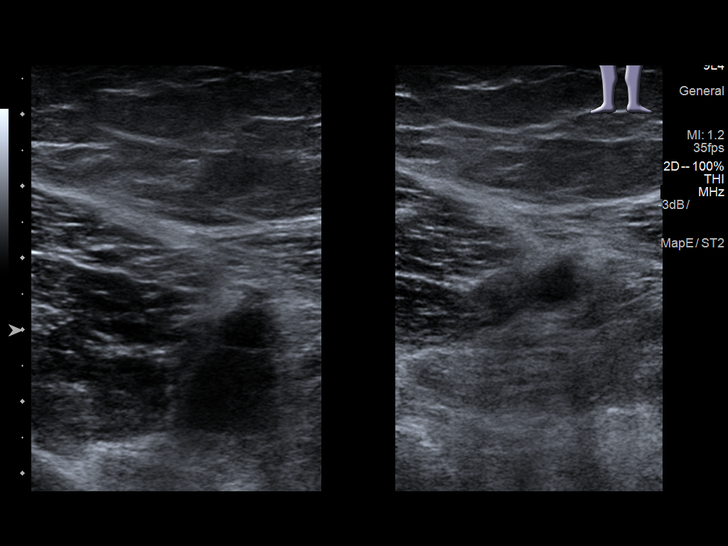
[im 25/34]
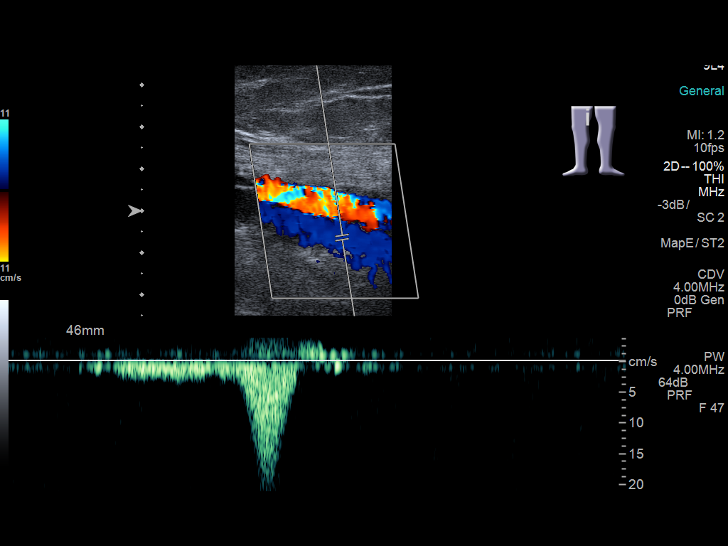
[im 28/34]
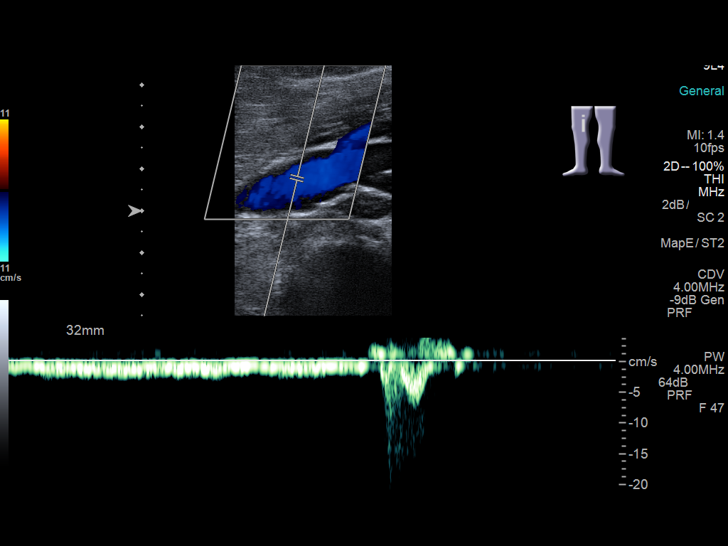
[im 31/34]
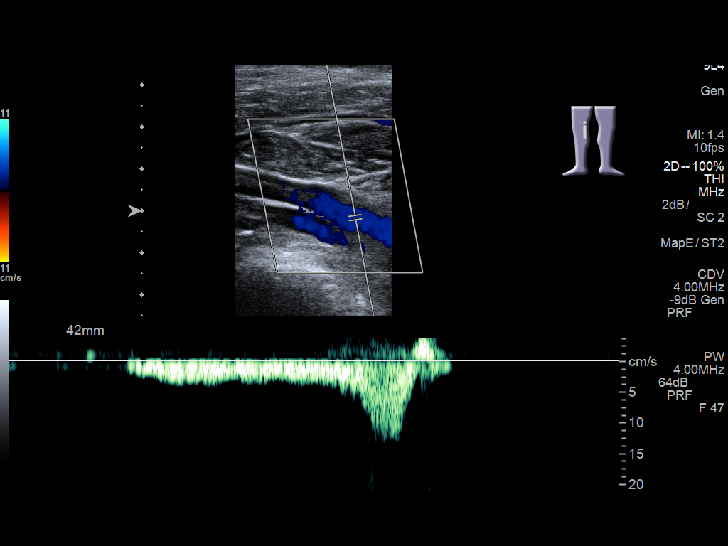
[im 34/34]
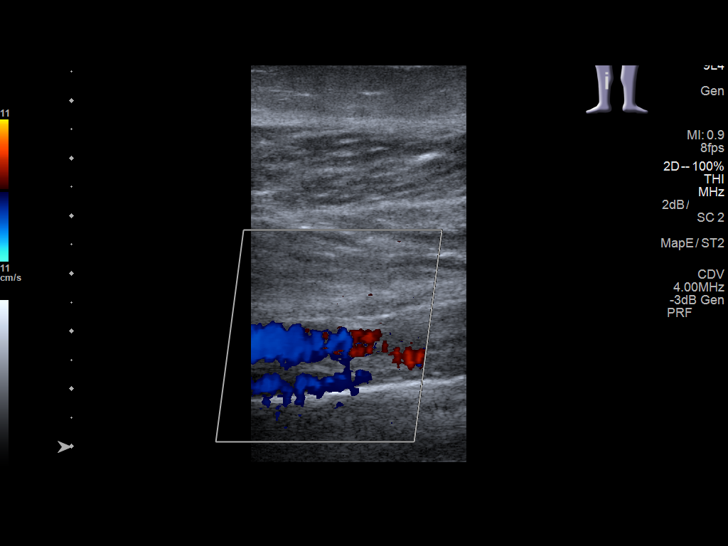

[13 of 24 positions shown; findings below may reference images not displayed]

FINDINGS: Contralateral Common Femoral Vein: Respiratory phasicity is normal
and symmetric with the symptomatic side. No evidence of thrombus.
Normal compressibility.

Common Femoral Vein: No evidence of thrombus. Normal
compressibility, respiratory phasicity and response to augmentation.

Saphenofemoral Junction: No evidence of thrombus. Normal
compressibility and flow on color Doppler imaging.

Profunda Femoral Vein: No evidence of thrombus. Normal
compressibility and flow on color Doppler imaging.

Femoral Vein: No evidence of thrombus. Normal compressibility,
respiratory phasicity and response to augmentation.

Popliteal Vein: No evidence of thrombus. Normal compressibility,
respiratory phasicity and response to augmentation.

Calf Veins: No evidence of thrombus. Normal compressibility and flow
on color Doppler imaging.

Superficial Great Saphenous Vein: No evidence of thrombus. Normal
compressibility and flow on color Doppler imaging.

Other Findings:  None.
IMPRESSION: No evidence of DVT within the right lower extremity.

## 2017-11-24 LAB — OB RESULTS CONSOLE RPR: RPR: NONREACTIVE

## 2017-11-24 NOTE — L&D Delivery Note (Signed)
Date of delivery: 12/16/17 Estimated Date of Delivery: 12/17/17 Patient's last menstrual period was 03/12/2017. EGA: 6527w6d  Delivery Note At 4:02 PM a viable female was delivered via Vaginal, Spontaneous (Presentation: cephalic; direct OA).  APGAR: 8, 9; weight 8 lb 11.7 oz (3960 g).   Placenta status: spontaneous, intact.  Cord: 3vv, nuchal x1, loose,  with the following complications: .  Cord pH: not collected  Anesthesia:  epidural Episiotomy: None Lacerations: None Suture Repair: n/a Est. Blood Loss (mL): 350cc (measured)  Mom presented to L&D for induction at term, cervical ripening with cytotec, AROM'd and augmented with pitocin.  epidual placed. Progressed to complete, second stage: 4 pushes. delivery of fetal head with restitution to LOT  Anterior then posterior shoulders delivered without difficulty.  Nuchal cord reduced after delivery.  Baby placed on mom's chest, and attended to by peds. Cord was then clamped and cut by FOB Lloyd HugerNeil when pulseless.  Placenta spontaneously delivered, intact.   IV pitocin given for hemorrhage prophylaxis.   We sang happy birthday to baby boy to be named!    Mom to postpartum.  Baby to Couplet care / Skin to Skin.  Monicka Cyran C Teola Felipe 12/16/2017, 5:52 PM

## 2017-11-25 LAB — OB RESULTS CONSOLE GC/CHLAMYDIA
Chlamydia: NEGATIVE
Gonorrhea: NEGATIVE

## 2017-11-25 LAB — OB RESULTS CONSOLE GBS: STREP GROUP B AG: POSITIVE

## 2017-12-15 ENCOUNTER — Inpatient Hospital Stay
Admission: EM | Admit: 2017-12-15 | Discharge: 2017-12-18 | DRG: 807 | Disposition: A | Discharge status: Home / Self Care | Payer: BLUE CROSS/BLUE SHIELD | Attending: Obstetrics and Gynecology | Admitting: Obstetrics and Gynecology

## 2017-12-15 DIAGNOSIS — Z3490 Encounter for supervision of normal pregnancy, unspecified, unspecified trimester: Secondary | ICD-10-CM | POA: Diagnosis present

## 2017-12-15 DIAGNOSIS — E669 Obesity, unspecified: Secondary | ICD-10-CM | POA: Diagnosis present

## 2017-12-15 DIAGNOSIS — O9912 Other diseases of the blood and blood-forming organs and certain disorders involving the immune mechanism complicating childbirth: Principal | ICD-10-CM | POA: Diagnosis present

## 2017-12-15 DIAGNOSIS — Z7982 Long term (current) use of aspirin: Secondary | ICD-10-CM

## 2017-12-15 DIAGNOSIS — O99214 Obesity complicating childbirth: Secondary | ICD-10-CM | POA: Diagnosis present

## 2017-12-15 DIAGNOSIS — Z3A39 39 weeks gestation of pregnancy: Secondary | ICD-10-CM

## 2017-12-15 DIAGNOSIS — D6959 Other secondary thrombocytopenia: Secondary | ICD-10-CM | POA: Diagnosis present

## 2017-12-15 DIAGNOSIS — O99824 Streptococcus B carrier state complicating childbirth: Secondary | ICD-10-CM | POA: Diagnosis present

## 2017-12-15 DIAGNOSIS — Z87891 Personal history of nicotine dependence: Secondary | ICD-10-CM

## 2017-12-15 MED ORDER — TERBUTALINE SULFATE 1 MG/ML IJ SOLN: 0.2500 mg | Freq: Once | INTRAMUSCULAR | Status: DC | PRN

## 2017-12-15 MED ORDER — ONDANSETRON HCL 4 MG/2ML IJ SOLN: 4.0000 mg | Freq: Four times a day (QID) | INTRAMUSCULAR | Status: DC | PRN

## 2017-12-15 MED ORDER — SODIUM CHLORIDE 0.9 % IV SOLN
1.0000 g | INTRAVENOUS | Status: DC
  Administered 2017-12-16: 1 g via INTRAVENOUS
  Filled 2017-12-15 (×7): qty 1000

## 2017-12-15 MED ORDER — LACTATED RINGERS IV SOLN
500.0000 mL | INTRAVENOUS | Status: DC | PRN
  Administered 2017-12-16 (×2): 500 mL via INTRAVENOUS

## 2017-12-15 MED ORDER — SODIUM CHLORIDE 0.9 % IV SOLN
2.0000 g | Freq: Once | INTRAVENOUS | Status: AC
  Administered 2017-12-16: 2 g via INTRAVENOUS
  Filled 2017-12-15: qty 2000

## 2017-12-15 MED ORDER — OXYTOCIN BOLUS FROM INFUSION
500.0000 mL | Freq: Once | INTRAVENOUS | Status: AC
  Administered 2017-12-16: 500 mL via INTRAVENOUS

## 2017-12-15 MED ORDER — OXYTOCIN 40 UNITS IN LACTATED RINGERS INFUSION - SIMPLE MED
2.5000 [IU]/h | INTRAVENOUS | Status: DC
  Administered 2017-12-16: 2.5 [IU]/h via INTRAVENOUS
  Filled 2017-12-15: qty 1000

## 2017-12-15 MED ORDER — SOD CITRATE-CITRIC ACID 500-334 MG/5ML PO SOLN: 30.0000 mL | ORAL | Status: DC | PRN

## 2017-12-15 MED ORDER — ACETAMINOPHEN 325 MG PO TABS: 650.0000 mg | ORAL_TABLET | ORAL | Status: DC | PRN

## 2017-12-15 MED ORDER — MISOPROSTOL 25 MCG QUARTER TABLET
25.0000 ug | ORAL_TABLET | ORAL | Status: DC | PRN
  Administered 2017-12-16 (×2): 25 ug via VAGINAL
  Filled 2017-12-15 (×3): qty 1

## 2017-12-15 MED ORDER — LACTATED RINGERS IV SOLN
INTRAVENOUS | Status: DC
  Administered 2017-12-16 (×2): via INTRAVENOUS

## 2017-12-15 MED ORDER — LIDOCAINE HCL (PF) 1 % IJ SOLN
30.0000 mL | INTRAMUSCULAR | Status: DC | PRN
  Filled 2017-12-15: qty 30

## 2017-12-16 ENCOUNTER — Inpatient Hospital Stay: Payer: BLUE CROSS/BLUE SHIELD | Admitting: Anesthesiology

## 2017-12-16 ENCOUNTER — Other Ambulatory Visit: Payer: Self-pay

## 2017-12-16 ENCOUNTER — Other Ambulatory Visit: Payer: Self-pay | Admitting: Obstetrics and Gynecology

## 2017-12-16 LAB — CBC
HCT: 37.6 % (ref 35.0–47.0)
Hemoglobin: 12.9 g/dL (ref 12.0–16.0)
MCH: 30.2 pg (ref 26.0–34.0)
MCHC: 34.4 g/dL (ref 32.0–36.0)
MCV: 87.7 fL (ref 80.0–100.0)
PLATELETS: 130 10*3/uL — AB (ref 150–440)
RBC: 4.28 MIL/uL (ref 3.80–5.20)
RDW: 14.1 % (ref 11.5–14.5)
WBC: 8.2 10*3/uL (ref 3.6–11.0)

## 2017-12-16 LAB — TYPE AND SCREEN
ABO/RH(D): A POS
Antibody Screen: NEGATIVE

## 2017-12-16 MED ORDER — DIPHENHYDRAMINE HCL 50 MG/ML IJ SOLN: 12.5000 mg | INTRAMUSCULAR | Status: DC | PRN

## 2017-12-16 MED ORDER — FENTANYL 2.5 MCG/ML W/ROPIVACAINE 0.15% IN NS 100 ML EPIDURAL (ARMC)
EPIDURAL | Status: AC
  Filled 2017-12-16: qty 100

## 2017-12-16 MED ORDER — MISOPROSTOL 200 MCG PO TABS
ORAL_TABLET | ORAL | Status: AC
  Administered 2017-12-16: 25 ug via VAGINAL
  Filled 2017-12-16: qty 4

## 2017-12-16 MED ORDER — LIDOCAINE HCL (PF) 1 % IJ SOLN
INTRAMUSCULAR | Status: DC | PRN
  Administered 2017-12-16 (×3): 3 mL

## 2017-12-16 MED ORDER — OXYTOCIN 10 UNIT/ML IJ SOLN
INTRAMUSCULAR | Status: AC
  Filled 2017-12-16: qty 2

## 2017-12-16 MED ORDER — LACTATED RINGERS IV SOLN: 500.0000 mL | Freq: Once | INTRAVENOUS | Status: DC

## 2017-12-16 MED ORDER — IBUPROFEN 600 MG PO TABS
600.0000 mg | ORAL_TABLET | Freq: Four times a day (QID) | ORAL | Status: DC
  Administered 2017-12-16 – 2017-12-18 (×6): 600 mg via ORAL
  Filled 2017-12-16 (×5): qty 1

## 2017-12-16 MED ORDER — IBUPROFEN 600 MG PO TABS
ORAL_TABLET | ORAL | Status: AC
  Administered 2017-12-16: 600 mg via ORAL
  Filled 2017-12-16: qty 1

## 2017-12-16 MED ORDER — BUPIVACAINE HCL (PF) 0.25 % IJ SOLN
INTRAMUSCULAR | Status: DC | PRN
  Administered 2017-12-16 (×2): 4 mL via EPIDURAL

## 2017-12-16 MED ORDER — AMMONIA AROMATIC IN INHA
RESPIRATORY_TRACT | Status: AC
  Filled 2017-12-16: qty 10

## 2017-12-16 MED ORDER — PHENYLEPHRINE 40 MCG/ML (10ML) SYRINGE FOR IV PUSH (FOR BLOOD PRESSURE SUPPORT)
80.0000 ug | PREFILLED_SYRINGE | INTRAVENOUS | Status: DC | PRN
  Filled 2017-12-16: qty 5

## 2017-12-16 MED ORDER — LIDOCAINE-EPINEPHRINE (PF) 1.5 %-1:200000 IJ SOLN
INTRAMUSCULAR | Status: DC | PRN
  Administered 2017-12-16: 3 mL via PERINEURAL

## 2017-12-16 MED ORDER — OXYTOCIN 40 UNITS IN LACTATED RINGERS INFUSION - SIMPLE MED
1.0000 m[IU]/min | INTRAVENOUS | Status: DC
  Administered 2017-12-16: 2 m[IU]/min via INTRAVENOUS

## 2017-12-16 MED ORDER — FENTANYL 2.5 MCG/ML W/ROPIVACAINE 0.15% IN NS 100 ML EPIDURAL (ARMC)
12.0000 mL/h | EPIDURAL | Status: DC
  Administered 2017-12-16: 12 mL/h via EPIDURAL

## 2017-12-16 MED ORDER — EPHEDRINE 5 MG/ML INJ
10.0000 mg | INTRAVENOUS | Status: DC | PRN
  Filled 2017-12-16: qty 2

## 2017-12-16 NOTE — H&P (Signed)
OB History & Physical   History of Present Illness:  Chief Complaint:   HPI:  Tracey Terry is a 37 y.o. [id number] female at [redacted]w[redacted]d dated by LMP with early ultrasound.  She presents to L&D for IOL at term.  +FM, no CTX, no LOF, no VB  Pregnancy Issues: 1. History of >20wk demise 2. Obesity 3. Gestational thrombocytopenia 4. AMA 5. GBS+  Maternal Medical History:   Past Medical History:  Diagnosis Date  . Medical history non-contributory     Past Surgical History:  Procedure Laterality Date  . DILATION AND EVACUATION N/A 01/30/2017   Procedure: DILATATION AND EVACUATION (D&E) 2ND TRIMESTER;  Surgeon: Elenora Fender Brenton Joines, MD;  Location: [facility] ORS;  Service: Gynecology;  Laterality: N/A;  . WISDOM TOOTH EXTRACTION      No Known Allergies  Prior to Admission medications   Medication Sig Start Date End Date Taking? Authorizing Provider  aspirin 81 MG chewable tablet Chew 81 mg by mouth daily.   Yes [provider]  folic acid (FOLVITE) 400 MCG tablet Take 400 mcg by mouth daily.   Yes [provider]  prenatal vitamin w/FE, FA (NATACHEW) 29-1 MG CHEW chewable tablet Chew 2 tablets by mouth daily at 12 noon.   Yes [provider]  ibuprofen (ADVIL,MOTRIN) 800 MG tablet Take 800 mg by mouth every 8 (eight) hours as needed.    [provider]  LORazepam (ATIVAN) 0.5 MG tablet Take 0.5 mg by mouth every 8 (eight) hours as needed for anxiety.    [provider]  methylergonovine (METHERGINE) 0.2 MG tablet Take 0.2 mg by mouth 4 (four) times daily.    [provider]  oxyCODONE-acetaminophen (PERCOCET/ROXICET) 5-325 MG tablet Take 1 tablet by mouth every 6 (six) hours as needed for severe pain.    [provider]     Prenatal care site: [facility]  Social History: She  reports that she quit smoking about 15 months ago. she has never used smokeless tobacco. She reports that she does not drink alcohol or use  drugs.  Family History: family history includes Hypertension in her father.   Review of Systems: A full review of systems was performed and negative except as noted in the HPI.     Physical Exam:  Vital Signs: BP 99/77   Pulse 73   Temp 98.3 F (36.8 C) (Oral)   Resp 18   Ht 5' 6.5" (1.689 m)   Wt 114.3 kg (252 lb)   LMP 03/12/2017   SpO2 96%   Breastfeeding? Unknown   BMI 40.06 kg/m  General: no acute distress.  HEENT: normocephalic, atraumatic Heart: regular rate & rhythm.  No murmurs/rubs/gallops Lungs: clear to auscultation bilaterally, normal respiratory effort Abdomen: soft, gravid, non-tender;  EFW: 8# Pelvic:   External: Normal external female genitalia  Cervix: 2/40/-3   Extremities: non-tender, symmetric, 1+ edema bilaterally.  DTRs: 2+ Neurologic: Alert & oriented x 3.    Results for orders placed or performed during the hospital encounter of 12/15/17 (from the past 24 hour(s))  CBC     Status: Abnormal   Collection Time: 12/16/17 12:27 AM  Result Value Ref Range   WBC 8.2 3.6 - 11.0 K/uL   RBC 4.28 3.80 - 5.20 MIL/uL   Hemoglobin 12.9 12.0 - 16.0 g/dL   HCT 45.4 09.8 - 11.9 %   MCV 87.7 80.0 - 100.0 fL   MCH 30.2 26.0 - 34.0 pg   MCHC 34.4 32.0 - 36.0 g/dL  RDW 14.1 11.5 - 14.5 %   Platelets 130 (L) 150 - 440 K/uL  Type and screen     Status: None   Collection Time: 12/16/17 12:27 AM  Result Value Ref Range   ABO/RH(D) A POS    Antibody Screen NEG    Sample Expiration      12/19/2017 Performed at [facility], [address] [place], [place] [address]     Pertinent Results:  Prenatal Labs: Blood type/Rh A+  Antibody screen neg  Rubella Immune  Varicella Immune  RPR NR  HBsAg Neg  HIV NR  GC neg  Chlamydia neg  Genetic screening negative  1 hour GTT 86, 91  3 hour GTT   GBS positive  +influenza and tdap vaccines  FHT: 135 mod + accels +variable  TOCO:occasional ctx   Cephalic by leopolds   Assessment:  Tracey Terry  is a 37 y.o. 133P2012 female at 4652w6d with IOL.   Plan:  1. Admit to Labor & Delivery 2. CBC, T&S, Clrs, IVF 3. GBS  Pos - start abx in labor or if S/AROM 4. Consents obtained. 5. Continuous efm/toco 6. Category 2 tracing but predominantly category 1 outside of the one variable. 7. cytotec for now for cervical ripening.  ----- Ranae Plumberhelsea Khiyan Crace, MD Attending Obstetrician and Gynecologist [facility], Department of OB/GYN [facility]

## 2017-12-16 NOTE — Anesthesia Procedure Notes (Signed)
Epidural Patient location during procedure: OB Start time: 12/16/2017 1:48 PM End time: 12/16/2017 2:09 PM  Staffing Anesthesiologist: Piscitello, Cleda MccreedyJoseph K, MD Resident/CRNA: Stormy Fabianurtis, Chasitee Zenker, CRNA Performed: anesthesiologist and resident/CRNA   Preanesthetic Checklist Completed: patient identified, site marked, surgical consent, pre-op evaluation, timeout performed, IV checked, risks and benefits discussed and monitors and equipment checked  Epidural Patient position: sitting Prep: Betadine Patient monitoring: heart rate, continuous pulse ox and blood pressure Approach: midline Location: L4-L5 Injection technique: LOR saline  Needle:  Needle type: Tuohy  Needle gauge: 17 G Needle length: 9 cm and 9 Needle insertion depth: 7 cm Catheter type: closed end flexible Catheter size: 19 Gauge Catheter at skin depth: 12 cm Test dose: negative and 1.5% lidocaine with Epi 1:200 K  Assessment Sensory level: T10 Events: blood not aspirated, injection not painful, no injection resistance, negative IV test and no paresthesia  Additional Notes Pt. Evaluated and documentation done after procedure finished. Patient identified. Risks/Benefits/Options discussed with patient including but not limited to bleeding, infection, nerve damage, paralysis, failed block, incomplete pain control, headache, blood pressure changes, nausea, vomiting, reactions to medication both or allergic, itching and postpartum back pain. Confirmed with bedside nurse the patient's most recent platelet count. Confirmed with patient that they are not currently taking any anticoagulation, have any bleeding history or any family history of bleeding disorders. Patient expressed understanding and wished to proceed. All questions were answered. Sterile technique was used throughout the entire procedure. Please see nursing notes for vital signs. Test dose was given through epidural catheter and negative prior to continuing to dose  epidural or start infusion. Warning signs of high block given to the patient including shortness of breath, tingling/numbness in hands, complete motor block, or any concerning symptoms with instructions to call for help. Patient was given instructions on fall risk and not to get out of bed. All questions and concerns addressed with instructions to call with any issues or inadequate analgesia.   Patient tolerated the insertion well without immediate complications.Reason for block:procedure for pain

## 2017-12-16 NOTE — Progress Notes (Signed)
Spoke with Dr. Elesa MassedWard @ [phone] and gave provider report of patient. Reported the 2 separate doses of Cytotec given, her cervical exam, and reported decel at 0220. Reported that otherwise category I strip. Provider agreered to start antibiotics once patient is in active labor. Will update provider as needed.

## 2017-12-16 NOTE — Progress Notes (Signed)
Patient up to bedside commode at 2110. Patient voided 800mL.

## 2017-12-16 NOTE — Anesthesia Preprocedure Evaluation (Signed)
Anesthesia Evaluation  Patient identified by MRN, date of birth, ID band Patient awake    Reviewed: Allergy & Precautions, H&P , NPO status , Patient's Chart, lab work & pertinent test results, reviewed documented beta blocker date and time   Airway Mallampati: II  TM Distance: >3 FB Neck ROM: full    Dental no notable dental hx.    Pulmonary former smoker,    Pulmonary exam normal breath sounds clear to auscultation       Cardiovascular Exercise Tolerance: Good negative cardio ROS   Rhythm:regular Rate:Normal     Neuro/Psych negative neurological ROS  negative psych ROS   GI/Hepatic negative GI ROS, Neg liver ROS,   Endo/Other  negative endocrine ROS  Renal/GU negative Renal ROS  negative genitourinary   Musculoskeletal   Abdominal   Peds  Hematology negative hematology ROS (+)   Anesthesia Other Findings   Reproductive/Obstetrics negative OB ROS                             Anesthesia Physical Anesthesia Plan  ASA: II  Anesthesia Plan: Epidural   Post-op Pain Management:    Induction:   PONV Risk Score and Plan:   Airway Management Planned:   Additional Equipment:   Intra-op Plan:   Post-operative Plan:   Informed Consent: I have reviewed the patients History and Physical, chart, labs and discussed the procedure including the risks, benefits and alternatives for the proposed anesthesia with the patient or authorized representative who has indicated his/her understanding and acceptance.   Dental Advisory Given  Plan Discussed with: Anesthesiologist  Anesthesia Plan Comments:         Anesthesia Quick Evaluation

## 2017-12-16 NOTE — Progress Notes (Signed)
Attempted to get patient up to bathroom. Right leg is however weak, could not walk on leg. Will try again at 2100. If right still weak will try bedside commode or bed pan.

## 2017-12-16 NOTE — Progress Notes (Signed)
Got in touch with Dr. Feliberto GottronSchermerhorn at [phone]. Reported to him that I have been unable to get in touch with Dr. Elesa MassedWard. That I have tried paging and calling multiple times, reported that I also tried calling and paging Dr. Dalbert GarnetBeasley and Elvera Lennox. McVey, CNM (see eMAR). Dr. Feliberto GottronSchermerhorn now aware of this patient and notified of decel @ 0220. No new orders at this time. He advised to keep trying to call Dr. Elesa MassedWard. Will report to Dr. Feliberto GottronSchermerhorn of any changes if unable to reach Dr. Elesa MassedWard.

## 2017-12-17 LAB — CBC
HEMATOCRIT: 36.7 % (ref 35.0–47.0)
Hemoglobin: 12.6 g/dL (ref 12.0–16.0)
MCH: 30 pg (ref 26.0–34.0)
MCHC: 34.2 g/dL (ref 32.0–36.0)
MCV: 87.9 fL (ref 80.0–100.0)
PLATELETS: 116 10*3/uL — AB (ref 150–440)
RBC: 4.18 MIL/uL (ref 3.80–5.20)
RDW: 14.3 % (ref 11.5–14.5)
WBC: 10.1 10*3/uL (ref 3.6–11.0)

## 2017-12-17 LAB — RPR: RPR: NONREACTIVE

## 2017-12-17 MED ORDER — ACETAMINOPHEN 500 MG PO TABS: 1000.0000 mg | ORAL_TABLET | Freq: Four times a day (QID) | ORAL | Status: DC | PRN

## 2017-12-17 MED ORDER — DIPHENHYDRAMINE HCL 25 MG PO CAPS: 25.0000 mg | ORAL_CAPSULE | Freq: Four times a day (QID) | ORAL | Status: DC | PRN

## 2017-12-17 MED ORDER — SODIUM CHLORIDE 0.9 % IV SOLN: 2.0000 g | Freq: Once | INTRAVENOUS | Status: DC

## 2017-12-17 MED ORDER — SIMETHICONE 80 MG PO CHEW: 80.0000 mg | CHEWABLE_TABLET | ORAL | Status: DC | PRN

## 2017-12-17 MED ORDER — PRENATAL MULTIVITAMIN CH
1.0000 | ORAL_TABLET | Freq: Every day | ORAL | Status: DC
  Administered 2017-12-17 – 2017-12-18 (×2): 1 via ORAL
  Filled 2017-12-17 (×2): qty 1

## 2017-12-17 MED ORDER — BUTORPHANOL TARTRATE 1 MG/ML IJ SOLN: 1.0000 mg | INTRAMUSCULAR | Status: DC | PRN

## 2017-12-17 MED ORDER — DIBUCAINE 1 % RE OINT
1.0000 | TOPICAL_OINTMENT | RECTAL | Status: DC | PRN
Start: 2017-12-17 — End: 2017-12-18
  Administered 2017-12-17: 1 via RECTAL
  Filled 2017-12-17: qty 28

## 2017-12-17 MED ORDER — ONDANSETRON HCL 4 MG PO TABS: 4.0000 mg | ORAL_TABLET | ORAL | Status: DC | PRN

## 2017-12-17 MED ORDER — DOCUSATE SODIUM 100 MG PO CAPS
100.0000 mg | ORAL_CAPSULE | Freq: Two times a day (BID) | ORAL | Status: DC
  Administered 2017-12-17 – 2017-12-18 (×3): 100 mg via ORAL
  Filled 2017-12-17 (×3): qty 1

## 2017-12-17 MED ORDER — WITCH HAZEL-GLYCERIN EX PADS
1.0000 "application " | MEDICATED_PAD | CUTANEOUS | Status: DC
  Administered 2017-12-17: 1 via TOPICAL
  Filled 2017-12-17: qty 100

## 2017-12-17 MED ORDER — COCONUT OIL OIL: 1.0000 "application " | TOPICAL_OIL | Status: DC | PRN

## 2017-12-17 MED ORDER — BENZOCAINE-MENTHOL 20-0.5 % EX AERO: 1.0000 "application " | INHALATION_SPRAY | CUTANEOUS | Status: DC | PRN

## 2017-12-17 MED ORDER — ONDANSETRON HCL 4 MG/2ML IJ SOLN: 4.0000 mg | INTRAMUSCULAR | Status: DC | PRN

## 2017-12-17 NOTE — Plan of Care (Signed)
Vs stable; up to void with 1 assisting (this is improved from needing 2 people to assist pt out of bed); Right leg still a little numb/tingly; only taking motrin so far for pain control; breastfeeding and does need a little assistance

## 2017-12-17 NOTE — Anesthesia Postprocedure Evaluation (Signed)
Anesthesia Post Note  Patient: Tracey Terry  Procedure(s) Performed: AN AD HOC LABOR EPIDURAL  Patient location during evaluation: PACU Anesthesia Type: Epidural Level of consciousness: awake, awake and alert and oriented Pain management: pain level controlled Vital Signs Assessment: post-procedure vital signs reviewed and stable Respiratory status: spontaneous breathing, nonlabored ventilation and respiratory function stable Cardiovascular status: stable Anesthetic complications: no     Last Vitals:  Vitals:   12/16/17 2304 12/17/17 0459  BP: 119/61 120/65  Pulse: 68 78  Resp: 20 20  Temp: 37.2 C 36.6 C  SpO2: 98% 98%    Last Pain:  Vitals:   12/17/17 0459  TempSrc: Oral  PainSc:                  Casey Burkitthuy Finnick Orosz

## 2017-12-17 NOTE — Plan of Care (Signed)
Vs stable; up ad lib; taking motrin for pain control; encouraged pt to breastfeed longer at each time; probable discharge tomorrow

## 2017-12-17 NOTE — Discharge Summary (Signed)
Obstetrical Discharge Summary  Patient Name: Tracey Terry DOB: [birth year] MRN: [id number]  Date of Admission: 12/15/17 Date of Delivery: 12/16/17 Delivered by: Ranae Plumber, MD Date of Discharge: 12/18/2017  Primary OB: [facility] WUJ:[id number] last menstrual period was 03/12/2017. EDC Estimated Date of Delivery: 12/17/17 Gestational Age at Delivery: [redacted]w[redacted]d   Antepartum complications:  1. History of >20wk demise 2. Obesity 3. Gestational thrombocytopenia 4. AMA 5. GBS+  Admitting Diagnosis:  Induction at term  Secondary Diagnosis: Patient Active Problem List   Diagnosis Date Noted  . Encounter for induction of labor 12/15/2017  . Supervision of high risk pregnancy in third trimester 05/29/2017    Augmentation: AROM, Pitocin and Cytotec Complications: None Intrapartum complications/course: Mom presented to L&D for induction at term, cervical ripening with cytotec, AROM'd and augmented with pitocin.  epidual placed. Progressed to complete, second stage: 4 pushes. delivery of fetal head with restitution to LOT  Anterior then posterior shoulders delivered without difficulty.  Nuchal cord reduced after delivery.  Baby placed on mom's chest, and attended to by peds. Cord was then clamped and cut by FOB Lloyd Huger when pulseless.  Placenta spontaneously delivered, intact.   IV pitocin given for hemorrhage prophylaxis Delivery Type: spontaneous vaginal delivery Anesthesia: epidural Placenta: sponatneous Laceration: none Episiotomy: none Newborn Data: Live born female  Birth Weight: 8 lb 11.7 oz (3960 g) APGAR: 8, 9  Newborn Delivery   Birth date/time:  12/16/2017 16:02:00 Delivery type:  Vaginal, Spontaneous     Postpartum Procedures: none  Post partum course:  Patient had an uncomplicated postpartum course.  By time of discharge on PPD#2, her pain was controlled on oral pain medications; she had appropriate lochia and was ambulating, voiding without difficulty and  tolerating regular diet.  She was deemed stable for discharge to home.    Discharge Physical Exam:  BP 120/68   Pulse 69   Temp 98.1 F (36.7 C) (Oral)   Resp 18   Ht 5' 6.5" (1.689 m)   Wt 252 lb (114.3 kg)   LMP 03/12/2017   SpO2 99%   Breastfeeding? Unknown   BMI 40.06 kg/m   General: NAD CV: RRR Pulm: CTABL, nl effort ABD: s/nd/nt, fundus firm and below the umbilicus Lochia: moderate DVT Evaluation: LE non-ttp, no evidence of DVT on exam.  Hemoglobin  Date Value Ref Range Status  12/17/2017 12.6 12.0 - 16.0 g/dL Final   HGB  Date Value Ref Range Status  07/13/2014 8.9 (L) 12.0 - 16.0 g/dL Final   HCT  Date Value Ref Range Status  12/17/2017 36.7 35.0 - 47.0 % Final  07/13/2014 27.8 (L) 35.0 - 47.0 % Final    Disposition: stable, discharge to home. Baby Feeding: breastmilk Baby Disposition: home with mom  Rh Immune globulin given: n/a Rubella vaccine given: n/a Tdap vaccine given in AP or PP setting: AP Flu vaccine given in AP or PP setting: AP  Contraception: IUD  Prenatal Labs:   Blood type/Rh A+  Antibody screen neg  Rubella Immune  Varicella Immune  RPR NR  HBsAg Neg  HIV NR  GC neg  Chlamydia neg  Genetic screening negative  1 hour GTT 86, 91  3 hour GTT   GBS positive     Plan:  Tracey Terry was discharged to home in good condition. Follow-up appointment with Dr. Elesa Massed in 6 weeks.  Discharge Medications: Allergies as of 12/18/2017   No Known Allergies     Medication List    STOP taking these medications  aspirin 81 MG chewable tablet   folic acid 400 MCG tablet Commonly known as:  FOLVITE   LORazepam 0.5 MG tablet Commonly known as:  ATIVAN   methylergonovine 0.2 MG tablet Commonly known as:  METHERGINE   oxyCODONE-acetaminophen 5-325 MG tablet Commonly known as:  PERCOCET/ROXICET     TAKE these medications   ibuprofen 600 MG tablet Commonly known as:  ADVIL,MOTRIN Take 1 tablet (600 mg total) by mouth every 6  (six) hours. What changed:    medication strength  how much to take  when to take this  reasons to take this   prenatal vitamin w/FE, FA 29-1 MG Chew chewable tablet Chew 2 tablets by mouth daily at 12 noon.       Follow-up Information    Ward, Elenora Fenderhelsea C, MD. Schedule an appointment as soon as possible for a visit in 6 week(s).   Specialty:  Obstetrics and Gynecology Why:  [facility] and make your own 6 week follow up appointment Contact information: [address] [place] [id number] [phone]           Signed: Randa NgoMcVey, Dorothy Landgrebe A, CNM 12/18/17

## 2017-12-17 NOTE — Progress Notes (Signed)
Post Partum Day 1  Subjective: Doing well, no concerns. Has had some difficulty getting up and out of bed with some persistent numbness in her right leg after epidural. Pain managed with PO meds, tolerating regular diet, and voiding without difficulty.   No fever/chills, chest pain, shortness of breath, nausea/vomiting, or leg pain. No nipple or breast pain.   Objective: BP 112/65 (BP Location: Left Arm)   Pulse 69   Temp 97.8 F (36.6 C) (Oral)   Resp 20   Ht 5' 6.5" (1.689 m)   Wt 114.3 kg (252 lb)   LMP 03/12/2017   SpO2 97%   Breastfeeding? Unknown   BMI 40.06 kg/m    Physical Exam:  General: alert, cooperative, appears stated age and no distress Breasts: soft/nontender CV: RRR Pulm: nl effort, CTABL Abdomen: soft, non-tender, active bowel sounds Uterine Fundus: firm Lochia: appropriate DVT Evaluation: No evidence of DVT seen on physical exam. No cords or calf tenderness. No significant calf/ankle edema.  Recent Labs    12/16/17 0027 12/17/17 0535  HGB 12.9 12.6  HCT 37.6 36.7  WBC 8.2 10.1  PLT 130* 116*    Assessment/Plan: 37 y.o. [id number] postpartum day # 1  -Continue routine PP care -Lactation consult  -Discussed contraceptive options: interested in Mirena IUD.  -Interested in discharge to home later today.   Disposition: Continue inpatient postpartum care with likely discharge to home later today.     LOS: 2 days   Genia DelMargaret Erisa Mehlman, CNM 12/17/2017, 9:07 AM   ----- Genia DelMargaret Yarnell Kozloski Certified Nurse Midwife [facility]

## 2017-12-18 MED ORDER — IBUPROFEN 600 MG PO TABS
600.0000 mg | ORAL_TABLET | Freq: Four times a day (QID) | ORAL | Status: DC
  Administered 2017-12-18: 600 mg via ORAL
  Filled 2017-12-18: qty 1

## 2017-12-18 MED ORDER — IBUPROFEN 600 MG PO TABS: 600.0000 mg | ORAL_TABLET | Freq: Four times a day (QID) | ORAL | 0 refills | Status: AC

## 2017-12-18 NOTE — Progress Notes (Signed)
Post Partum Day 2 Subjective: Doing well, no complaints.  Tolerating regular diet, pain with PO meds, voiding and ambulating without difficulty.  No CP SOB Fever,Chills, N/V or leg pain; denies nipple or breast pain no HA change of vision, RUQ/epigastric pain  Objective: BP 120/68   Pulse 69   Temp 98.1 F (36.7 C) (Oral)   Resp 18   Ht 5' 6.5" (1.689 m)   Wt 252 lb (114.3 kg)   LMP 03/12/2017   SpO2 99%   Breastfeeding? Unknown   BMI 40.06 kg/m    Physical Exam:  General: NAD Breasts: soft/nontender CV: RRR Pulm: nl effort, CTABL Abdomen: soft, NT, BS x 4 Perineum: minimal edema, intactLochia: moderate Uterine Fundus: fundus firm and 1 fb below umbilicus DVT Evaluation: no cords, ttp LEs   Recent Labs    12/16/17 0027 12/17/17 0535  HGB 12.9 12.6  HCT 37.6 36.7  WBC 8.2 10.1  PLT 130* 116*    Assessment/Plan: 37 y.o. [id number] postpartum day # 2  - Continue routine PP care - Lactation consult prn - Discussed contraceptive options: Pt plans for PP IUD.  - Immunization status:  all Imms up to date    Disposition: Does desire Dc home today.     Keyira Mondesir A, CNM 12/18/17  8:59 AM

## 2017-12-18 NOTE — Discharge Instructions (Signed)
Vaginal Delivery, Care After °Refer to this sheet in the next few weeks. These instructions provide you with information about caring for yourself after vaginal delivery. Your health care provider may also give you more specific instructions. Your treatment has been planned according to current medical practices, but problems sometimes occur. Call your health care provider if you have any problems or questions. °What can I expect after the procedure? °After vaginal delivery, it is common to have: °· Some bleeding from your vagina. °· Soreness in your abdomen, your vagina, and the area of skin between your vaginal opening and your anus (perineum). °· Pelvic cramps. °· Fatigue. ° °Follow these instructions at home: °Medicines °· Take over-the-counter and prescription medicines only as told by your health care provider. °· If you were prescribed an antibiotic medicine, take it as told by your health care provider. Do not stop taking the antibiotic until it is finished. °Driving ° °· Do not drive or operate heavy machinery while taking prescription pain medicine. °· Do not drive for 24 hours if you received a sedative. °Lifestyle °· Do not drink alcohol. This is especially important if you are breastfeeding or taking medicine to relieve pain. °· Do not use tobacco products, including cigarettes, chewing tobacco, or e-cigarettes. If you need help quitting, ask your health care provider. °Eating and drinking °· Drink at least 8 eight-ounce glasses of water every day unless you are told not to by your health care provider. If you choose to breastfeed your baby, you may need to drink more water than this. °· Eat high-fiber foods every day. These foods may help prevent or relieve constipation. High-fiber foods include: °? Whole grain cereals and breads. °? Brown rice. °? Beans. °? Fresh fruits and vegetables. °Activity °· Return to your normal activities as told by your health care provider. Ask your health care provider  what activities are safe for you. °· Rest as much as possible. Try to rest or take a nap when your baby is sleeping. °· Do not lift anything that is heavier than your baby or 10 lb (4.5 kg) until your health care provider says that it is safe. °· Talk with your health care provider about when you can engage in sexual activity. This may depend on your: °? Risk of infection. °? Rate of healing. °? Comfort and desire to engage in sexual activity. °Vaginal Care °· If you have an episiotomy or a vaginal tear, check the area every day for signs of infection. Check for: °? More redness, swelling, or pain. °? More fluid or blood. °? Warmth. °? Pus or a bad smell. °· Do not use tampons or douches until your health care provider says this is safe. °· Watch for any blood clots that may pass from your vagina. These may look like clumps of dark red, brown, or black discharge. °General instructions °· Keep your perineum clean and dry as told by your health care provider. °· Wear loose, comfortable clothing. °· Wipe from front to back when you use the toilet. °· Ask your health care provider if you can shower or take a bath. If you had an episiotomy or a perineal tear during labor and delivery, your health care provider may tell you not to take baths for a certain length of time. °· Wear a bra that supports your breasts and fits you well. °· If possible, have someone help you with household activities and help care for your baby for at least a few days after   you leave the hospital. °· Keep all follow-up visits for you and your baby as told by your health care provider. This is important. °Contact a health care provider if: °· You have: °? Vaginal discharge that has a bad smell. °? Difficulty urinating. °? Pain when urinating. °? A sudden increase or decrease in the frequency of your bowel movements. °? More redness, swelling, or pain around your episiotomy or vaginal tear. °? More fluid or blood coming from your episiotomy or  vaginal tear. °? Pus or a bad smell coming from your episiotomy or vaginal tear. °? A fever. °? A rash. °? Little or no interest in activities you used to enjoy. °? Questions about caring for yourself or your baby. °· Your episiotomy or vaginal tear feels warm to the touch. °· Your episiotomy or vaginal tear is separating or does not appear to be healing. °· Your breasts are painful, hard, or turn red. °· You feel unusually sad or worried. °· You feel nauseous or you vomit. °· You pass large blood clots from your vagina. If you pass a blood clot from your vagina, save it to show to your health care provider. Do not flush blood clots down the toilet without having your health care provider look at them. °· You urinate more than usual. °· You are dizzy or light-headed. °· You have not breastfed at all and you have not had a menstrual period for 12 weeks after delivery. °· You have stopped breastfeeding and you have not had a menstrual period for 12 weeks after you stopped breastfeeding. °Get help right away if: °· You have: °? Pain that does not go away or does not get better with medicine. °? Chest pain. °? Difficulty breathing. °? Blurred vision or spots in your vision. °? Thoughts about hurting yourself or your baby. °· You develop pain in your abdomen or in one of your legs. °· You develop a severe headache. °· You faint. °· You bleed from your vagina so much that you fill two sanitary pads in one hour. °This information is not intended to replace advice given to you by your health care provider. Make sure you discuss any questions you have with your health care provider. °Document Released: 11/07/2000 Document Revised: 04/23/2016 Document Reviewed: 11/25/2015 °Elsevier Interactive Patient Education © 2018 [employer] ° °

## 2017-12-18 NOTE — Progress Notes (Signed)
Patient discharged home with infant and significant other. Discharge instructions, prescriptions and follow up appointment given to and reviewed with patient and significant other. Patient verbalized understanding. Escorted out via wheelchair by auxiliary.  

## 2021-05-06 ENCOUNTER — Other Ambulatory Visit: Payer: Self-pay | Admitting: Obstetrics and Gynecology

## 2021-05-06 DIAGNOSIS — Z1231 Encounter for screening mammogram for malignant neoplasm of breast: Secondary | ICD-10-CM

## 2021-05-09 ENCOUNTER — Other Ambulatory Visit: Payer: Self-pay

## 2021-05-09 ENCOUNTER — Ambulatory Visit
Admission: RE | Admit: 2021-05-09 | Discharge: 2021-05-09 | Disposition: A | Discharge status: Home / Self Care | Payer: BC Managed Care – PPO | Source: Ambulatory Visit | Attending: Obstetrics and Gynecology | Admitting: Obstetrics and Gynecology

## 2021-05-09 DIAGNOSIS — Z1231 Encounter for screening mammogram for malignant neoplasm of breast: Secondary | ICD-10-CM | POA: Insufficient documentation

## 2021-05-14 ENCOUNTER — Ambulatory Visit (INDEPENDENT_AMBULATORY_CARE_PROVIDER_SITE_OTHER): Payer: BC Managed Care – PPO | Admitting: Vascular Surgery

## 2021-05-14 ENCOUNTER — Other Ambulatory Visit: Payer: Self-pay

## 2021-05-14 DIAGNOSIS — I83813 Varicose veins of bilateral lower extremities with pain: Secondary | ICD-10-CM | POA: Diagnosis not present

## 2021-05-14 NOTE — Progress Notes (Signed)
Patient ID: Tracey Terry, female   DOB: [birth year], 40 y.o.   MRN: [id number]  Chief Complaint  Patient presents with   New Patient (Initial Visit)    NP consult rt calf varicosities with specified complications . Ref by [email or web address]    HPI Tracey Terry is a 40 y.o. female.  I am asked to see the patient by Dr. Feliberto Gottron for evaluation of symptomatic varicose veins.  The patient presents with complaints of symptomatic varicosities of the lower extremities. The patient reports a long standing history of varicosities and they have become painful over time.  She first noticed this with her second pregnancy which ended in a miscarriage at 20 weeks.  With her third pregnancy about 3-1/2 years ago, it became more severe and has never really gotten better even after delivery.  The right leg is more severly affected. The patient elevates the legs for relief. The pain is described as heaviness and stinging in the calf and lower leg.  She also describes her leg is feeling tight on the inside. The symptoms are generally most severe in the evening, particularly when they have been on their feet for long periods of time.  Anti-inflammatories and elevation has been used to try to improve the symptoms with limited success. The patient complains of occasional swelling as an associated symptom. The patient has no previous history of deep venous thrombosis or superficial thrombophlebitis to their knowledge.     Past Medical History:  Diagnosis Date   Medical history non-contributory     Past Surgical History:  Procedure Laterality Date   DILATION AND EVACUATION N/A 01/30/2017   Procedure: DILATATION AND EVACUATION (D&E) 2ND TRIMESTER;  Surgeon: Elenora Fender Ward, MD;  Location: [facility] ORS;  Service: Gynecology;  Laterality: N/A;   WISDOM TOOTH EXTRACTION      Family History  Problem Relation Age of Onset   Hypertension Father    Hypertension Maternal Grandmother       Social History    Tobacco Use   Smoking status: Former    Pack years: 0.00    Types: Cigarettes    Quit date: 08/24/2016    Years since quitting: 4.7   Smokeless tobacco: Never  Substance Use Topics   Alcohol use: No   Drug use: No     No Known Allergies  Current Outpatient Medications  Medication Sig Dispense Refill   levonorgestrel (MIRENA) 20 MCG/DAY IUD by Intrauterine route.     ibuprofen (ADVIL,MOTRIN) 600 MG tablet Take 1 tablet (600 mg total) by mouth every 6 (six) hours. 30 tablet 0   prenatal vitamin w/FE, FA (NATACHEW) 29-1 MG CHEW chewable tablet Chew 2 tablets by mouth daily at 12 noon.     No current facility-administered medications for this visit.      REVIEW OF SYSTEMS (Negative unless checked)  Constitutional: [] Weight loss  [] Fever  [] Chills Cardiac: [] Chest pain   [] Chest pressure   [] Palpitations   [] Shortness of breath when laying flat   [] Shortness of breath at rest   [] Shortness of breath with exertion. Vascular:  [x] Pain in legs with walking   [x] Pain in legs at rest   [] Pain in legs when laying flat   [] Claudication   [] Pain in feet when walking  [] Pain in feet at rest  [] Pain in feet when laying flat   [] History of DVT   [] Phlebitis   [] Swelling in legs   [x] Varicose veins   [] Non-healing ulcers Pulmonary:   [] Uses home oxygen   []   Productive cough   [] Hemoptysis   [] Wheeze  [] COPD   [] Asthma Neurologic:  [] Dizziness  [] Blackouts   [] Seizures   [] History of stroke   [] History of TIA  [] Aphasia   [] Temporary blindness   [] Dysphagia   [] Weakness or numbness in arms   [] Weakness or numbness in legs Musculoskeletal:  [] Arthritis   [] Joint swelling   [] Joint pain   [] Low back pain Hematologic:  [] Easy bruising  [] Easy bleeding   [] Hypercoagulable state   [] Anemic  [] Hepatitis Gastrointestinal:  [] Blood in stool   [] Vomiting blood  [] Gastroesophageal reflux/heartburn   [] Abdominal pain Genitourinary:  [] Chronic kidney disease   [] Difficult urination  [] Frequent urination   [] Burning with urination   [] Hematuria Skin:  [] Rashes   [] Ulcers   [] Wounds Psychological:  [] History of anxiety   []  History of major depression.    Physical Exam BP 107/75   Pulse (!) 178   Ht 5\' 6"  (1.676 m)   Wt 225 lb (102.1 kg)   BMI 36.32 kg/m  Gen:  WD/WN, NAD Head: [place]/AT, No temporalis wasting.  Ear/Nose/Throat: Hearing grossly intact, dentition good Eyes: Sclera non-icteric. Conjunctiva clear Neck: Supple. Trachea midline Pulmonary:  Good air movement, no use of accessory muscles, respirations not labored.  Cardiac: RRR, No JVD Vascular: Varicosities diffuse and measuring up to 2-3 mm in the right lower extremity        Varicosities scattered and measuring up to 1-2 mm in the left lower extremity Vessel Right Left  Radial Palpable Palpable                          PT Palpable Palpable  DP Palpable Palpable   Gastrointestinal: soft, non-tender/non-distended.  Musculoskeletal: M/S 5/5 throughout.   No significant lower extremity edema this morning Neurologic: Sensation grossly intact in extremities.  Symmetrical.  Speech is fluent.  Psychiatric: Judgment intact, Mood & affect appropriate for pt's clinical situation. Dermatologic: No rashes or ulcers noted.  No cellulitis or open wounds.    Radiology MM 3D SCREEN BREAST BILATERAL  Result Date: 05/13/2021 CLINICAL DATA:  Screening. EXAM: DIGITAL SCREENING BILATERAL MAMMOGRAM WITH TOMOSYNTHESIS AND CAD TECHNIQUE: Bilateral screening digital craniocaudal and mediolateral oblique mammograms were obtained. Bilateral screening digital breast tomosynthesis was performed. The images were evaluated with computer-aided detection. COMPARISON:  None. ACR Breast Density Category b: There are scattered areas of fibroglandular density. FINDINGS: In the left breast, a possible asymmetry warrants further evaluation. In the right breast, no findings suspicious for malignancy. IMPRESSION: Further evaluation is suggested for possible  asymmetry in the left breast. RECOMMENDATION: Diagnostic mammogram and possibly ultrasound of the left breast. (Code:[id number]) The patient will be contacted regarding the findings, and additional imaging will be scheduled. BI-RADS CATEGORY  0: Incomplete. Need additional imaging evaluation and/or prior mammograms for comparison. Electronically Signed   By: M.D.   On: 05/13/2021 08:47   Labs No results found for this or any previous visit (from the past 2160 hour(s)).  Assessment/Plan:  Varicose veins of leg with pain, bilateral  The patient has symptoms consistent with chronic venous insufficiency. We discussed the natural history and treatment options for venous disease. I recommended the regular use of 20 - 30 mm Hg compression stockings, and prescribed these today. I recommended leg elevation and anti-inflammatories as needed for pain. I have also recommended a complete venous duplex to assess the venous system for reflux or thrombotic issues. This can be done at the patient's convenience. I  will see the patient back in 3 months to assess the response to conservative management, and determine further treatment options.     Festus Barren 05/14/2021, 10:37 AM   This note was created with Dragon medical transcription system.  Any errors from dictation are unintentional.

## 2021-05-22 ENCOUNTER — Other Ambulatory Visit: Payer: Self-pay | Admitting: Obstetrics and Gynecology

## 2021-05-22 DIAGNOSIS — R928 Other abnormal and inconclusive findings on diagnostic imaging of breast: Secondary | ICD-10-CM

## 2021-05-22 DIAGNOSIS — N6489 Other specified disorders of breast: Secondary | ICD-10-CM

## 2021-05-24 ENCOUNTER — Ambulatory Visit
Admission: RE | Admit: 2021-05-24 | Discharge: 2021-05-24 | Disposition: A | Discharge status: Home / Self Care | Payer: BC Managed Care – PPO | Source: Ambulatory Visit | Attending: Obstetrics and Gynecology | Admitting: Obstetrics and Gynecology

## 2021-05-24 ENCOUNTER — Other Ambulatory Visit: Payer: Self-pay

## 2021-05-24 DIAGNOSIS — N6489 Other specified disorders of breast: Secondary | ICD-10-CM | POA: Diagnosis present

## 2021-05-24 DIAGNOSIS — R928 Other abnormal and inconclusive findings on diagnostic imaging of breast: Secondary | ICD-10-CM

## 2021-08-13 ENCOUNTER — Ambulatory Visit (INDEPENDENT_AMBULATORY_CARE_PROVIDER_SITE_OTHER): Payer: BC Managed Care – PPO | Admitting: Vascular Surgery

## 2021-08-13 ENCOUNTER — Encounter (INDEPENDENT_AMBULATORY_CARE_PROVIDER_SITE_OTHER): Payer: BC Managed Care – PPO

## 2022-07-24 ENCOUNTER — Encounter: Payer: Self-pay | Admitting: Emergency Medicine

## 2022-07-24 ENCOUNTER — Other Ambulatory Visit: Payer: Self-pay

## 2022-07-24 ENCOUNTER — Emergency Department: Payer: BC Managed Care – PPO

## 2022-07-24 DIAGNOSIS — R0789 Other chest pain: Secondary | ICD-10-CM | POA: Diagnosis not present

## 2022-07-24 DIAGNOSIS — R1011 Right upper quadrant pain: Secondary | ICD-10-CM | POA: Insufficient documentation

## 2022-07-24 DIAGNOSIS — R079 Chest pain, unspecified: Secondary | ICD-10-CM | POA: Diagnosis present

## 2022-07-24 LAB — COMPREHENSIVE METABOLIC PANEL
ALT: 27 U/L (ref 0–44)
AST: 23 U/L (ref 15–41)
Albumin: 4 g/dL (ref 3.5–5.0)
Alkaline Phosphatase: 44 U/L (ref 38–126)
Anion gap: 5 (ref 5–15)
BUN: 11 mg/dL (ref 6–20)
CO2: 26 mmol/L (ref 22–32)
Calcium: 8.9 mg/dL (ref 8.9–10.3)
Chloride: 108 mmol/L (ref 98–111)
Creatinine, Ser: 0.84 mg/dL (ref 0.44–1.00)
GFR, Estimated: >60 mL/min (ref >60)
Glucose, Bld: 105 mg/dL — ABNORMAL HIGH (ref 70–99)
Potassium: 3.9 mmol/L (ref 3.5–5.1)
Sodium: 139 mmol/L (ref 135–145)
Total Bilirubin: 0.9 mg/dL (ref 0.3–1.2)
Total Protein: 7.1 g/dL (ref 6.5–8.1)

## 2022-07-24 LAB — URINALYSIS, ROUTINE W REFLEX MICROSCOPIC
Bacteria, UA: NONE SEEN
Bilirubin Urine: NEGATIVE
Glucose, UA: NEGATIVE mg/dL
Ketones, ur: NEGATIVE mg/dL
Leukocytes,Ua: NEGATIVE
Nitrite: NEGATIVE
Protein, ur: NEGATIVE mg/dL
Specific Gravity, Urine: 1.005 (ref 1.005–1.030)
pH: 7 (ref 5.0–8.0)

## 2022-07-24 LAB — CBC WITH DIFFERENTIAL/PLATELET
Abs Immature Granulocytes: 0 10*3/uL (ref 0.00–0.07)
Basophils Absolute: 0 10*3/uL (ref 0.0–0.1)
Basophils Relative: 0 %
Eosinophils Absolute: 0.1 10*3/uL (ref 0.0–0.5)
Eosinophils Relative: 1 %
HCT: 42.2 % (ref 36.0–46.0)
Hemoglobin: 14.1 g/dL (ref 12.0–15.0)
Immature Granulocytes: 0 %
Lymphocytes Relative: 27 %
Lymphs Abs: 1.3 10*3/uL (ref 0.7–4.0)
MCH: 28.5 pg (ref 26.0–34.0)
MCHC: 33.4 g/dL (ref 30.0–36.0)
MCV: 85.3 fL (ref 80.0–100.0)
Monocytes Absolute: 0.8 10*3/uL (ref 0.1–1.0)
Monocytes Relative: 15 %
Neutro Abs: 2.8 10*3/uL (ref 1.7–7.7)
Neutrophils Relative %: 57 %
Platelets: 151 10*3/uL (ref 150–400)
RBC: 4.95 MIL/uL (ref 3.87–5.11)
RDW: 13.2 % (ref 11.5–15.5)
WBC: 4.9 10*3/uL (ref 4.0–10.5)
nRBC: 0 % (ref 0.0–0.2)

## 2022-07-24 LAB — POC URINE PREG, ED: Preg Test, Ur: NEGATIVE

## 2022-07-24 LAB — TROPONIN I (HIGH SENSITIVITY): Troponin I (High Sensitivity): <2 ng/L (ref <18)

## 2022-07-24 LAB — LIPASE, BLOOD: Lipase: 37 U/L (ref 11–51)

## 2022-07-24 LAB — D-DIMER, QUANTITATIVE: D-Dimer, Quant: 0.95 ug/mL-FEU — ABNORMAL HIGH (ref 0.00–0.50)

## 2022-07-24 NOTE — ED Triage Notes (Signed)
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

## 2022-07-25 ENCOUNTER — Emergency Department: Payer: BC Managed Care – PPO

## 2022-07-25 ENCOUNTER — Emergency Department
Admission: EM | Admit: 2022-07-25 | Discharge: 2022-07-25 | Disposition: A | Discharge status: Home / Self Care | Payer: BC Managed Care – PPO | Attending: Emergency Medicine | Admitting: Emergency Medicine

## 2022-07-25 DIAGNOSIS — R0789 Other chest pain: Secondary | ICD-10-CM

## 2022-07-25 DIAGNOSIS — R109 Unspecified abdominal pain: Secondary | ICD-10-CM

## 2022-07-25 LAB — TROPONIN I (HIGH SENSITIVITY): Troponin I (High Sensitivity): <2 ng/L (ref <18)

## 2022-07-25 MED ORDER — IOHEXOL 350 MG/ML SOLN
100.0000 mL | Freq: Once | INTRAVENOUS | Status: AC | PRN
  Administered 2022-07-25: 100 mL via INTRAVENOUS

## 2022-07-25 NOTE — ED Provider Notes (Signed)
[facility] Provider Note    Event Date/Time   First MD Initiated Contact with Patient 07/25/22 0251     (approximate)   History   Chest Pain   HPI  Tracey Terry is a 41 y.o. female who presents to the ED for evaluation of Chest Pain   Patient presents to the ED for evaluation of chest pain.  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  She went out with some friends while international, then returned 2 days ago.  She reports her congestion getting better, but she developed chest pain on the right side of her body earlier today.  She reports pain to her RUQ along her right flank and right-sided chest without associated symptoms.  Denies nausea, emesis, shortness of breath, syncope, fever, trauma   She reports feeling better now by the time I see her and is not sure what happened.   Physical Exam   Triage Vital Signs: ED Triage Vitals [07/24/22 2227]  Enc Vitals Group     BP 110/73     Pulse Rate 83     Resp 20     Temp 98.2 F (36.8 C)     Temp Source Oral     SpO2 98 %     Weight 210 lb (95.3 kg)     Height 5\' 7"  (1.702 m)     Head Circumference      Peak Flow      Pain Score 9     Pain Loc      Pain Edu?      Excl. in GC?     Most recent vital signs: Vitals:   07/25/22 0300 07/25/22 0330  BP: (!) 110/47 (!) 102/47  Pulse: 91 70  Resp:  16  Temp:    SpO2:  97%    General: Awake, no distress.  Looks systemically well. CV:  Good peripheral perfusion. RRR Resp:  Normal effort. CTAB Abd:  No distention.  Soft and benign MSK:  No deformity noted.  No rash, deformity or signs of trauma to her right flank and side.  No tenderness to palpation.   Neuro:  No focal deficits appreciated. Other:     ED Results / Procedures / Treatments   Labs (all labs ordered are listed, but only abnormal results are  displayed) Labs Reviewed  COMPREHENSIVE METABOLIC PANEL - Abnormal; Notable for the following components:      Result Value   Glucose, Bld 105 (*)    All other components within normal limits  D-DIMER, QUANTITATIVE - Abnormal; Notable for the following components:   D-Dimer, Quant 0.95 (*)    All other components within normal limits  URINALYSIS, ROUTINE W REFLEX MICROSCOPIC - Abnormal; Notable for the following components:   Color, Urine STRAW (*)    APPearance CLEAR (*)    Hgb urine dipstick SMALL (*)    All other components within normal limits  CBC WITH DIFFERENTIAL/PLATELET  LIPASE, BLOOD  POC URINE PREG, ED  TROPONIN I (HIGH SENSITIVITY)  TROPONIN I (HIGH SENSITIVITY)    EKG Sinus rhythm with a rate of 93 bpm.  Normal axis and intervals.  No clear signs of acute ischemia.  No comparison.  RADIOLOGY CXR interpreted by me without evidence of acute cardiopulmonary pathology. RUQ ultrasound interpreted by me without evidence of gallstones or cholecystitis  Official radiology  report(s): US ABDOMEN LIMITED RUQ (LIVER/GB)  Result Date: 07/25/2022 CLINICAL DATA:  Right upper quadrant abdominal pain EXAM: ULTRASOUND ABDOMEN LIMITED RIGHT UPPER QUADRANT COMPARISON:  None Available. FINDINGS: Gallbladder: The gallbladder is mildly contracted. No gallstones or abnormal wall thickening visualized. No sonographic Murphy sign noted by sonographer. Common bile duct: Diameter: 2-3 mm in proximal diameter Liver: No focal lesion identified. Within normal limits in parenchymal echogenicity. Portal vein is patent on color Doppler imaging with normal direction of blood flow towards the liver. Other: Small right pleural effusion noted IMPRESSION: 1. No evidence of cholelithiasis or acute cholecystitis. 2. Small right pleural effusion. Electronically Signed   By: Helyn Numbers M.D.   On: 07/25/2022 02:26   CT Angio Chest PE W/Cm &/Or Wo Cm  Result Date: 07/25/2022 CLINICAL DATA:  Pulmonary embolism  (PE) suspected, positive D-dimer. Right chest and abdominal pain. EXAM: CT ANGIOGRAPHY CHEST WITH CONTRAST TECHNIQUE: Multidetector CT imaging of the chest was performed using the standard protocol during bolus administration of intravenous contrast. Multiplanar CT image reconstructions and MIPs were obtained to evaluate the vascular anatomy. RADIATION DOSE REDUCTION: This exam was performed according to the departmental dose-optimization program which includes automated exposure control, adjustment of the mA and/or kV according to patient size and/or use of iterative reconstruction technique. CONTRAST:  OMNIPAQUE IOHEXOL 350 MG/ML SOLN COMPARISON:  None Available. FINDINGS: Cardiovascular: Adequate opacification of the pulmonary arterial tree. No intraluminal filling defect identified to suggest acute pulmonary embolism. Central pulmonary arteries are of normal caliber. No significant coronary artery calcification. Cardiac size within normal limits. No pericardial effusion. No significant atherosclerotic calcification within the thoracic aorta. No aortic aneurysm. Mediastinum/Nodes: No enlarged mediastinal, hilar, or axillary lymph nodes. Thyroid gland, trachea, and esophagus demonstrate no significant findings. Lungs/Pleura: Lungs are clear. No pleural effusion or pneumothorax. Upper Abdomen: 4 mm nonobstructing calculus noted within the upper pole of the right kidney. No acute abnormality. Musculoskeletal: No chest wall abnormality. No acute or significant osseous findings. Review of the MIP images confirms the above findings. IMPRESSION: 1. No pulmonary embolism. No acute intrathoracic pathology identified. 2. 4 mm nonobstructing right renal calculus. Electronically Signed   By: Helyn Numbers M.D.   On: 07/25/2022 01:08   DG Chest 2 View  Result Date: 07/24/2022 CLINICAL DATA:  Chest pain EXAM: CHEST - 2 VIEW COMPARISON:  None Available. FINDINGS: The heart size and mediastinal contours are within  normal limits. Both lungs are clear. The visualized skeletal structures are unremarkable. IMPRESSION: No active cardiopulmonary disease. Electronically Signed   By: Alcide Clever M.D.   On: 07/24/2022 23:06    PROCEDURES and INTERVENTIONS:  .1-3 Lead EKG Interpretation  Performed by: Delton Prairie, MD Authorized by: Delton Prairie, MD     Interpretation: normal     ECG rate:  70   ECG rate assessment: normal     Rhythm: sinus rhythm     Ectopy: none     Conduction: normal     Medications  iohexol (OMNIPAQUE) 350 MG/ML injection 100 mL (100 mLs Intravenous Contrast Given 07/25/22 0050)     IMPRESSION / MDM / ASSESSMENT AND PLAN / ED COURSE  I reviewed the triage vital signs and the nursing notes.  Differential diagnosis includes, but is not limited to, ACS, PTX, PNA, muscle strain/spasm, PE, dissection  {Patient presents with symptoms of an acute illness or injury that is potentially life-threatening.  Pleasant 41 year old woman presents to the ED with right-sided pain of uncertain etiology and ultimately suitable for  outpatient management.  Look systemically well and has reassuring examination.  No signs of rash, herpes zoster, neurologic or vascular deficits.  Work-up is generally benign with nonischemic EKG, 2 negative troponins, normal lipase, metabolic panel and CBC.  Urine without infectious features.  D-dimer was elevated and so CTA chest performed without evidence of PE, pneumothorax or other derangements.  Similarly reassuring RUQ ultrasound.  Ultimately her symptoms are not present while she is here and she has a benign work-up.  I considered observation admission for this patient, but we decided upon outpatient management with close return precautions.      FINAL CLINICAL IMPRESSION(S) / ED DIAGNOSES   Final diagnoses:  Right-sided chest wall pain  Right sided abdominal pain  Other chest pain     Rx / DC Orders   ED Discharge Orders     None        Note:  This  document was prepared using Dragon voice recognition software and may include unintentional dictation errors.   Delton Prairie, MD 07/25/22 [phone]

## 2022-07-25 NOTE — Discharge Instructions (Signed)
Please take Tylenol and ibuprofen/Advil for your pain.  It is safe to take them together, or to alternate them every few hours.  Take up to 1000mg of Tylenol at a time, up to 4 times per day.  Do not take more than 4000 mg of Tylenol in 24 hours.  For ibuprofen, take 400-600 mg, 3 - 4 times per day.  

## 2022-08-22 ENCOUNTER — Other Ambulatory Visit
Admission: RE | Admit: 2022-08-22 | Discharge: 2022-08-22 | Disposition: A | Discharge status: Home / Self Care | Payer: BC Managed Care – PPO | Source: Ambulatory Visit | Attending: Obstetrics and Gynecology | Admitting: Obstetrics and Gynecology

## 2022-08-22 DIAGNOSIS — Z01419 Encounter for gynecological examination (general) (routine) without abnormal findings: Secondary | ICD-10-CM | POA: Insufficient documentation

## 2022-08-22 DIAGNOSIS — R7989 Other specified abnormal findings of blood chemistry: Secondary | ICD-10-CM | POA: Diagnosis not present

## 2022-08-22 LAB — D-DIMER, QUANTITATIVE: D-Dimer, Quant: 0.35 ug/mL-FEU (ref 0.00–0.50)

## 2022-09-22 ENCOUNTER — Encounter (INDEPENDENT_AMBULATORY_CARE_PROVIDER_SITE_OTHER): Payer: Self-pay

## 2024-12-08 ENCOUNTER — Encounter: Payer: Self-pay | Admitting: Family Medicine

## 2024-12-08 DIAGNOSIS — Z1231 Encounter for screening mammogram for malignant neoplasm of breast: Secondary | ICD-10-CM

## 2024-12-14 ENCOUNTER — Other Ambulatory Visit: Payer: Self-pay | Admitting: Family Medicine

## 2024-12-14 DIAGNOSIS — N644 Mastodynia: Secondary | ICD-10-CM

## 2024-12-23 ENCOUNTER — Ambulatory Visit
Admission: RE | Admit: 2024-12-23 | Discharge: 2024-12-23 | Disposition: A | Source: Ambulatory Visit | Attending: Family Medicine | Admitting: Family Medicine

## 2024-12-23 DIAGNOSIS — N644 Mastodynia: Secondary | ICD-10-CM
